# Patient Record
Sex: Male | Born: 1980 | Race: White | Hispanic: No | Marital: Single | Smoking: Former smoker
Health system: Southern US, Community
[De-identification: ages and names within clinical notes are randomized; demographics above are authoritative.]

## PROBLEM LIST (undated history)

## (undated) DIAGNOSIS — E349 Endocrine disorder, unspecified: Secondary | ICD-10-CM

## (undated) DIAGNOSIS — F930 Separation anxiety disorder of childhood: Secondary | ICD-10-CM

## (undated) DIAGNOSIS — K921 Melena: Secondary | ICD-10-CM

## (undated) DIAGNOSIS — D751 Secondary polycythemia: Secondary | ICD-10-CM

## (undated) DIAGNOSIS — H9319 Tinnitus, unspecified ear: Secondary | ICD-10-CM

## (undated) HISTORY — DX: Tinnitus, unspecified ear: H93.19

## (undated) HISTORY — PX: CARPAL TUNNEL RELEASE: SHX101

## (undated) HISTORY — PX: TESTICLE REMOVAL: SHX68

## (undated) HISTORY — DX: Separation anxiety disorder of childhood: F93.0

## (undated) HISTORY — DX: Melena: K92.1

## (undated) HISTORY — DX: Endocrine disorder, unspecified: E34.9

---

## 2015-10-29 ENCOUNTER — Ambulatory Visit (INDEPENDENT_AMBULATORY_CARE_PROVIDER_SITE_OTHER): Payer: BLUE CROSS/BLUE SHIELD | Admitting: Primary Care

## 2015-10-29 ENCOUNTER — Encounter (INDEPENDENT_AMBULATORY_CARE_PROVIDER_SITE_OTHER): Payer: Self-pay

## 2015-10-29 ENCOUNTER — Encounter: Payer: Self-pay | Admitting: Primary Care

## 2015-10-29 VITALS — BP 146/84 | HR 78 | Temp 98.4°F | Ht 71.0 in | Wt 235.8 lb

## 2015-10-29 DIAGNOSIS — E291 Testicular hypofunction: Secondary | ICD-10-CM

## 2015-10-29 DIAGNOSIS — F930 Separation anxiety disorder of childhood: Secondary | ICD-10-CM | POA: Insufficient documentation

## 2015-10-29 DIAGNOSIS — E349 Endocrine disorder, unspecified: Secondary | ICD-10-CM | POA: Insufficient documentation

## 2015-10-29 DIAGNOSIS — H9319 Tinnitus, unspecified ear: Secondary | ICD-10-CM | POA: Insufficient documentation

## 2015-10-29 DIAGNOSIS — H9312 Tinnitus, left ear: Secondary | ICD-10-CM

## 2015-10-29 MED ORDER — TESTOSTERONE CYPIONATE 200 MG/ML IM SOLN: 200.0000 mg | INTRAMUSCULAR | Status: DC

## 2015-10-29 NOTE — Assessment & Plan Note (Signed)
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 Bad divorce that ended due to infidelity.  Offered therapy, he would like to see psychiatrist. Referral placed. Denies SI/HI recently. Currently no medication management.

## 2015-10-29 NOTE — Patient Instructions (Addendum)
Stop by the front desk and speak with either Shirlee LimerickMarion or Revonda StandardAllison regarding your referral to Psychiatry, ENT and Urology.   Urology will manage your testosterone deficiency moving forward. Please establish care with them as discussed.  Please schedule a physical with me within the next 3-6 months. You may also schedule a lab only appointment 3-4 days prior. We will discuss your lab results in detail during your physical.  It was a pleasure to meet you today! Please don't hesitate to call me with any questions. Welcome to [facility]!
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
## 2015-10-29 NOTE — Assessment & Plan Note (Signed)
Located to left ear for years. Symptoms wax and wane. Would like specialist opinion for treatment even though he's aware that treatment may be limited. Referral placed to ENT for further evaluation.

## 2015-10-29 NOTE — Assessment & Plan Note (Signed)
Present since infancy after dog attack which required surgical removal of testicles. Managed on injections currently and needs refills. [place] and needs to establish with Urology for monitoring. Refill provided. Referral placed for Urology management.

## 2015-10-29 NOTE — Progress Notes (Signed)
Pre visit review using our clinic review tool, if applicable. No additional management support is needed unless otherwise documented below in the visit note. 

## 2015-11-05 ENCOUNTER — Telehealth: Payer: Self-pay | Admitting: *Deleted

## 2015-11-05 ENCOUNTER — Other Ambulatory Visit: Payer: Self-pay | Admitting: *Deleted

## 2015-11-05 DIAGNOSIS — E349 Endocrine disorder, unspecified: Secondary | ICD-10-CM

## 2015-11-05 NOTE — Telephone Encounter (Signed)
[facility] for prior authorization  Placed in Kate's inbox to complete.

## 2015-11-05 NOTE — Telephone Encounter (Signed)
Completed and placed in Chans inbox. 

## 2015-11-05 NOTE — Telephone Encounter (Signed)
Testosterone injections requiring PA. Completed on Cover My Meds. Will await determination.

## 2015-11-06 NOTE — Telephone Encounter (Signed)
Sent the form with the lab results to [facility].

## 2015-11-06 NOTE — Telephone Encounter (Signed)
Arthur Ford [facility] requesting testosterone labs total number and reference range faxed to [phone].

## 2015-11-07 NOTE — Telephone Encounter (Addendum)
Per Johny Drillinghan, this has already been done. See below.

## 2015-11-12 NOTE — Telephone Encounter (Signed)
Pt lmovm stating that he is still not able to pick up Rx--lmovm--will call insurance

## 2015-11-13 NOTE — Telephone Encounter (Signed)
Called patient back. Patient did sound a little upset but not at us, the situation since he is out of the medication. Notified patient that I did send the form and lab result [facility] on 11/06/2015. Patient stated that he was told by [facility] Burna Mortimer(Wanda) from claims dept that they need to know his symptoms of why he needs the testosterone.  Also just received fax from [facility] that the Prior Auth was denial because the request does not meet the definition of Medical Necessity found in the member's benefit booklet. It also stated this medication is approved for males with symptoms of low testosterone. In this case, the member does not have symptoms of low testosterone.   Patient would like us to call [employer] to give symptoms. Please advise.

## 2015-11-13 NOTE — Telephone Encounter (Signed)
Please call patient back at [phone].

## 2015-11-14 NOTE — Telephone Encounter (Signed)
Also if after the received the additional information and still dental then next step would be an appeal.

## 2015-11-14 NOTE — Telephone Encounter (Signed)
Called [employer] regarding the prior auth. Was told that we can send in additional information like progress notes and labs to [facility] courtesy review. Will send Kate's progress notes of last visit and labs from patient again.

## 2015-11-14 NOTE — Telephone Encounter (Signed)
His most recent labs have been scanned into Epic, please include this with my progress notes. If we need updated labs, then please schedule him at his convenience.  Thanks.

## 2015-11-14 NOTE — Telephone Encounter (Signed)
Received additional form to complete. Placed in Arthur Ford's inbox.

## 2015-11-14 NOTE — Telephone Encounter (Signed)
Based off review from my last note his symptoms include fatigue and low sex drive. Please call [employer] to see what we can do to help this patient.  Any medication alternatives that would be approved? Androgel?  What can we do to get this patient's medication/testosterone approved?

## 2015-11-14 NOTE — Telephone Encounter (Signed)
Arthur Ford. Sent recent labs and Kate's progress notes to [facility] provider courtesy review at [phone]. Ref # [id number]

## 2015-11-19 NOTE — Telephone Encounter (Signed)
Received faxed response back from [facility]esy [facility].  Request has been review and now is approved.  Effective dates of 11/05/2015 thru 08/17/2038.

## 2015-11-19 NOTE — Telephone Encounter (Signed)
Called and notified patient of results. Patient verbalized understanding 

## 2016-05-14 DIAGNOSIS — Z125 Encounter for screening for malignant neoplasm of prostate: Secondary | ICD-10-CM | POA: Diagnosis not present

## 2016-05-14 DIAGNOSIS — E291 Testicular hypofunction: Secondary | ICD-10-CM | POA: Diagnosis not present

## 2016-05-19 DIAGNOSIS — Z125 Encounter for screening for malignant neoplasm of prostate: Secondary | ICD-10-CM | POA: Diagnosis not present

## 2016-05-19 DIAGNOSIS — Q550 Absence and aplasia of testis: Secondary | ICD-10-CM | POA: Diagnosis not present

## 2016-05-19 DIAGNOSIS — E291 Testicular hypofunction: Secondary | ICD-10-CM | POA: Diagnosis not present

## 2016-05-19 DIAGNOSIS — N521 Erectile dysfunction due to diseases classified elsewhere: Secondary | ICD-10-CM | POA: Diagnosis not present

## 2017-01-20 DIAGNOSIS — E291 Testicular hypofunction: Secondary | ICD-10-CM | POA: Diagnosis not present

## 2017-01-20 DIAGNOSIS — N521 Erectile dysfunction due to diseases classified elsewhere: Secondary | ICD-10-CM | POA: Diagnosis not present

## 2017-01-28 DIAGNOSIS — N521 Erectile dysfunction due to diseases classified elsewhere: Secondary | ICD-10-CM | POA: Diagnosis not present

## 2017-01-28 DIAGNOSIS — E291 Testicular hypofunction: Secondary | ICD-10-CM | POA: Diagnosis not present

## 2017-03-13 ENCOUNTER — Other Ambulatory Visit: Payer: Self-pay | Admitting: Urology

## 2017-03-13 DIAGNOSIS — D751 Secondary polycythemia: Secondary | ICD-10-CM
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
## 2017-03-31 ENCOUNTER — Ambulatory Visit (HOSPITAL_COMMUNITY): Admission: RE | Admit: 2017-03-31 | Payer: BLUE CROSS/BLUE SHIELD | Source: Ambulatory Visit | Admitting: Urology

## 2017-04-03 ENCOUNTER — Encounter (HOSPITAL_COMMUNITY): Payer: Self-pay

## 2017-04-03 ENCOUNTER — Ambulatory Visit (HOSPITAL_COMMUNITY)
Admission: RE | Admit: 2017-04-03 | Discharge: 2017-04-03 | Disposition: A | Discharge status: Home / Self Care | Payer: BLUE CROSS/BLUE SHIELD | Source: Ambulatory Visit | Attending: Urology | Admitting: Urology

## 2017-04-03 DIAGNOSIS — D751 Secondary polycythemia: Secondary | ICD-10-CM | POA: Insufficient documentation

## 2017-04-03 HISTORY — DX: Secondary polycythemia: D75.1

## 2017-04-03 NOTE — Discharge Instructions (Signed)
Polycythemia  Polycythemia vera (PV), or myeloproliferative disease, which the bone marrow makes too many (overproduces) red blood cells. The bone marrow may also make too many clotting cells (platelets) and white blood cells. Bone marrow is the spongy center of bones where blood cells are produced. Sometimes, there may be an overproduction of blood cells in the liver and spleen, causing those organs to become enlarged. Additionally, people who have PV are at a higher risk for stroke or heart attack because their blood may clot more easily. PV is a long-term disease. What are the causes? Almost all people who have PV have an abnormal gene (genetic mutation) that causes changes in the way that the bone marrow makes blood cells. This gene, which is called JAK2, is not passed along from parent to child (is not hereditary). It is not known what triggers the genetic mutation that causes the body to produce too many red blood cells. What increases the risk? This condition is more likely to develop in:  Males.  People who are 39 years of age or older.  What are the signs or symptoms? You may not have any symptoms in the early stage of PV. When symptoms develop, they may include:  Shortness of breath.  Dizziness.  Hot and flushed skin.  Itchy skin.  Sweats, especially night sweats.  Headache.  Tiredness.  Ringing in the ears.  Blurred vision or blind spots.  Bone pain.  Weight loss.  Fever.  Blood-tinged vomit or bowel movements.  How is this diagnosed? This condition may be diagnosed during a routine physical exam if you have a blood test called a complete blood count (CBC). Your health care provider also may suspect PV if you have symptoms. During the physical exam, your provider may find that you have an enlarged liver or spleen. You may also have tests to confirm the diagnosis. These may include:  A procedure to remove a sample of bone marrow for testing (bone marrow  biopsy).  Blood tests to check for: ? The JAK2 gene. ? Low levels of a hormone that helps to regulate blood production (erythropoietin).  How is this treated? There is no cure for PV, but treatment can help to control the disease. There are several types of treatment. No single treatment works for everyone. You will need to work with a blood cancer specialist (hematologist) to find the treatment that is best for you. Options include:  Periodically having some blood removed with a needle (drawn) to lower the number of red blood cells (phlebotomy).  Medicine. Your health care provider may recommend: ? Low-dose aspirin to lower your risk for blood clots. ? A medicine to reduce red blood cell production (hydroxyurea). ? A medicine to lower the number of red blood cells (interferon). ? A medicine that slows down the effects of JAK2 (ruxolitinib).  Follow these instructions at home:  Take over-the-counter and prescription medicines only as told by your health care provider.  Return to your normal activities as told by your health care provider. Ask your health care provider what activities are safe for you.  Do not use tobacco products, including cigarettes, chewing tobacco, or e-cigarettes. If you need help quitting, ask your health care provider.  Keep all follow-up visits as told by your health care provider. This is important. Contact a health care provider if:  You have side effects from your medicines.  Your symptoms change or get worse at home.  You have blood in your stool or you vomit  blood. Get help right away if:  You have sudden and severe pain in your abdomen.  You have chest pain or difficulty breathing.  You have signs of stroke, such as: ? Sudden numbness. ? Weakness of your face or arm. ? Confusion. ? Difficulty speaking or understanding speech. These symptoms may represent a serious problem that is an emergency. Do not wait to see if the symptoms will go away.  Get medical help right away. Call your local emergency services (911 in the [place] Do not drive yourself to the hospital. This information is not intended to replace advice given to you by your health care provider. Make sure you discuss any questions you have with your health care provider. Document Released: 04/29/2001 Document Revised: 01/10/2016 Document Reviewed: 02/14/2015 Elsevier Interactive Patient Education  2018 [employer]. Therapeutic Phlebotomy, Care After Refer to this sheet in the next few weeks. These instructions provide you with information about caring for yourself after your procedure. Your health care provider may also give you more specific instructions. Your treatment has been planned according to current medical practices, but problems sometimes occur. Call your health care provider if you have any problems or questions after your procedure. What can I expect after the procedure? After the procedure, it is common to have:  Light-headedness or dizziness. You may feel faint.  Nausea.  Tiredness.  Follow these instructions at home: Activity  Return to your normal activities as directed by your health care provider. Most people can go back to their normal activities right away.  Avoid strenuous physical activity and heavy lifting or pulling for about 5 hours after the procedure. Do not lift anything that is heavier than 10 lb (4.5 kg).  Athletes should avoid strenuous exercise for at least 12 hours.  Change positions slowly for the remainder of the day. This will help to prevent light-headedness or fainting.  If you feel light-headed, lie down until the feeling goes away. Eating and drinking  Be sure to eat well-balanced meals for the next 24 hours.  Drink enough fluid to keep your urine clear or pale yellow.  Avoid drinking alcohol on the day that you had the procedure. Care of the Needle Insertion Site  Keep your bandage dry. You can remove the bandage  after about 5 hours or as directed by your health care provider.  If you have bleeding from the needle insertion site, elevate your arm and press firmly on the site until the bleeding stops.  If you have bruising at the site, apply ice to the area: ? Put ice in a plastic bag. ? Place a towel between your skin and the bag. ? Leave the ice on for 20 minutes, 2-3 times a day for the first 24 hours.  If the swelling does not go away after 24 hours, apply a warm, moist washcloth to the area for 20 minutes, 2-3 times a day. General instructions  Avoid smoking for at least 30 minutes after the procedure.  Keep all follow-up visits as directed by your health care provider. It is important to continue with further therapeutic phlebotomy treatments as directed. Contact a health care provider if:  You have redness, swelling, or pain at the needle insertion site.  You have fluid, blood, or pus coming from the needle insertion site.  You feel light-headed, dizzy, or nauseated, and the feeling does not go away.  You notice new bruising at the needle insertion site.  You feel weaker than normal.  You have a  fever or chills. Get help right away if:  You have severe nausea or vomiting.  You have chest pain.  You have trouble breathing. This information is not intended to replace advice given to you by your health care provider. Make sure you discuss any questions you have with your health care provider. Document Released: 01/06/2011 Document Revised: 04/05/2016 Document Reviewed: 07/31/2014 Elsevier Interactive Patient Education  [employer].

## 2017-04-03 NOTE — Progress Notes (Signed)
Arthur Ford presents today for phlebotomy per MD orders. Phlebotomy procedure started at 1150 and ended at 1200. 150 cc removed. Patient tolerated procedure well.

## 2017-07-23 DIAGNOSIS — Z125 Encounter for screening for malignant neoplasm of prostate: Secondary | ICD-10-CM | POA: Diagnosis not present

## 2017-07-23 DIAGNOSIS — E291 Testicular hypofunction: Secondary | ICD-10-CM | POA: Diagnosis not present

## 2017-10-02 DIAGNOSIS — H52223 Regular astigmatism, bilateral: Secondary | ICD-10-CM | POA: Diagnosis not present

## 2017-10-16 DIAGNOSIS — E291 Testicular hypofunction: Secondary | ICD-10-CM | POA: Diagnosis not present

## 2017-10-16 DIAGNOSIS — N521 Erectile dysfunction due to diseases classified elsewhere: Secondary | ICD-10-CM | POA: Diagnosis not present

## 2017-10-26 ENCOUNTER — Encounter: Payer: Self-pay | Admitting: Family Medicine

## 2017-10-26 ENCOUNTER — Ambulatory Visit: Payer: BLUE CROSS/BLUE SHIELD | Admitting: Family Medicine

## 2017-10-26 VITALS — BP 118/76 | HR 81 | Temp 98.3°F | Wt 243.0 lb

## 2017-10-26 DIAGNOSIS — M545 Low back pain, unspecified: Secondary | ICD-10-CM | POA: Insufficient documentation

## 2017-10-26 HISTORY — DX: Low back pain, unspecified: M54.50

## 2017-10-26 MED ORDER — METHOCARBAMOL 500 MG PO TABS: 500.0000 mg | ORAL_TABLET | Freq: Three times a day (TID) | ORAL | 0 refills | Status: DC | PRN

## 2017-10-26 MED ORDER — PREDNISONE 20 MG PO TABS: ORAL_TABLET | ORAL | 0 refills | Status: DC

## 2017-10-26 NOTE — Assessment & Plan Note (Signed)
Anticipate lumbar strain. Neurological exam benign today. Isolated episode of bowel accident with injury but able to control bowels/bladder since. Anticipate due to acute pain. rec supportive care - rest, prednisone course, muscle relaxant, then restart NSAID, ice/heating pad, gentle stretching. Out of work x 3 days. Update if not improving as expected or red flags or neurological changes. Pt agrees with plan.

## 2017-10-26 NOTE — Patient Instructions (Addendum)
Possible lumbar strain suffered last night. Treat with prednisone course and muscle relaxant. May use ice pack to back for next few days then transition to heating pad.  Gentle stretching of back as tolerated.  Let us know if any fevers, numbness/weakness of legs, or trouble controlling bowel/bladder.   Low Back Sprain A sprain is a stretch or tear in the bands of tissue that hold bones and joints together (ligaments). Sprains of the lower back (lumbar spine) are a common cause of low back pain. A sprain occurs when ligaments are overextended or stretched beyond their limits. The ligaments can become inflamed, resulting in pain and sudden muscle tightening (spasms). A sprain can be caused by an injury (trauma), or it can develop gradually due to overuse. There are three types of sprains:  Grade 1 is a mild sprain involving an overstretched ligament or a very slight tear of the ligament.  Grade 2 is a moderate sprain involving a partial tear of the ligament.  Grade 3 is a severe sprain involving a complete tear of the ligament.  What are the causes? This condition may be caused by:  Trauma, such as a fall or a hit to the body.  Twisting or overstretching the back. This may result from doing activities that require a lot of energy, such as lifting heavy objects.  What increases the risk? The following factors may increase your risk of getting this condition:  Playing contact sports.  Participating in sports or activities that put excessive stress on the back and require a lot of bending and twisting, including: ? Lifting weights or heavy objects. ? Gymnastics. ? Soccer. ? Figure skating. ? Snowboarding.  Being overweight or obese.  Having poor strength and flexibility.  What are the signs or symptoms? Symptoms of this condition may include:  Sharp or dull pain in the lower back that does not go away. Pain may extend to the buttocks.  Stiffness.  Limited range of  motion.  Inability to stand up straight due to stiffness or pain.  Muscle spasms.  How is this diagnosed?  This condition may be diagnosed based on:  Your symptoms.  Your medical history.  A physical exam. ? Your health care provider may push on certain areas of your back to determine the source of your pain. ? You may be asked to bend forward, backward, and side to side to assess the severity of your pain and your range of motion.  Imaging tests, such as: ? X-rays. ? MRI.  How is this treated? Treatment for this condition may include:  Applying heat and cold to the affected area.  Medicines to help relieve pain and to relax your muscles (muscle relaxants).  NSAIDs to help reduce swelling and discomfort.  Physical therapy.  When your symptoms improve, it is important to gradually return to your normal routine as soon as possible to reduce pain, avoid stiffness, and avoid loss of muscle strength. Generally, symptoms should improve within 6 weeks of treatment. However, recovery time varies. Follow these instructions at home: Managing pain, stiffness, and swelling  If directed, apply ice to the injured area during the first 24 hours after your injury. ? Put ice in a plastic bag. ? Place a towel between your skin and the bag. ? Leave the ice on for 20 minutes, 2-3 times a day.  If directed, apply heat to the affected area as often as told by your health care provider. Use the heat source that your health care provider  recommends, such as a moist heat pack or a heating pad. ? Place a towel between your skin and the heat source. ? Leave the heat on for 20-30 minutes. ? Remove the heat if your skin turns bright red. This is especially important if you are unable to feel pain, heat, or cold. You may have a greater risk of getting burned. Activity  Rest and return to your normal activities as told by your health care provider. Ask your health care provider what activities are  safe for you.  Avoid activities that take a lot of effort (are strenuous) for as long as told by your health care provider.  Do exercises as told by your health care provider. General instructions   Take over-the-counter and prescription medicines only as told by your health care provider.  If you have questions or concerns about safety while taking pain medicine, talk with your health care provider.  Do not drive or operate heavy machinery until you know how your pain medicine affects you.  Do not use any tobacco products, such as cigarettes, chewing tobacco, and e-cigarettes. Tobacco can delay bone healing. If you need help quitting, ask your health care provider.  Keep all follow-up visits as told by your health care provider. This is important. How is this prevented?  Warm up and stretch before being active.  Cool down and stretch after being active.  Give your body time to rest between periods of activity.  Avoid: ? Being physically inactive for long periods at a time. ? Exercising or playing sports when you are tired or in pain.  Use correct form when playing sports and lifting heavy objects.  Use good posture when sitting and standing.  Maintain a healthy weight.  Sleep on a mattress with medium firmness to support your back.  Make sure to use equipment that fits you, including shoes that fit well.  Be safe and responsible while being active to avoid falls.  Do at least 150 minutes of moderate-intensity exercise each week, such as brisk walking or water aerobics. Try a form of exercise that takes stress off your back, such as swimming or stationary cycling.  Maintain physical fitness, including: ? Strength. In particular, develop and maintain strong abdominal muscles. ? Flexibility. ? Cardiovascular fitness. ? Endurance. Contact a health care provider if:  Your back pain does not improve after 6 weeks of treatment.  Your symptoms get worse. Get help right  away if:  Your back pain is severe.  You are unable to stand or walk.  You develop pain in your legs.  You develop weakness in your buttocks or legs.  You have difficulty controlling when you urinate or when you have a bowel movement. This information is not intended to replace advice given to you by your health care provider. Make sure you discuss any questions you have with your health care provider. Document Released: 08/04/2005 Document Revised: 04/10/2016 Document Reviewed: 05/16/2015 Elsevier Interactive Patient Education  [employer]2018 [employer]

## 2017-10-26 NOTE — Progress Notes (Signed)
BP 118/76 (BP Location: Left Arm, Patient Position: Sitting, Cuff Size: Large)   Pulse 81   Temp 98.3 F (36.8 C) (Oral)   Wt 243 lb (110.2 kg)   SpO2 97%   BMI 33.89 kg/m    CC: back pain Subjective:    Patient ID: Arthur Ford, male    DOB: [birth year], 37 y.o.   MRN: [id number]  HPI: Arthur Ford is a 38 y.o. male presenting on 10/26/2017 for Back Pain (Bent at work last night while lowering a 15 lb item and felt a "pop". Initially felt pain in lower right back but now feels in lower left side. Pain is constant and dull. With movement, pain is sharp at about a 7. Took ibuprofen 800 mg at 2:00 AM, not helpful. 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 Initial pain R lower back, now L lower back pain as well. Currently 4/10 pain. Treated with 4 ibuprofen twice overnight. Worse with transitions to standing - sharp 7/10 pain. Denies shooting pain down legs. No numbness or weakness of legs. No saddle anesthesia. No fevers/chills. Episode of bowel incontinence when it happened, not since.   H/o similar injury years ago which improved after chiropractor therapy.  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 Told deep muscle bruise.  Relevant past medical, surgical, family and social history reviewed and updated as indicated. Interim medical history since our last visit reviewed. Allergies and medications reviewed and updated. Outpatient Medications Prior to Visit  Medication Sig Dispense Refill  . ibuprofen (ADVIL,MOTRIN) 200 MG tablet Take 200 mg by mouth every 6 (six) hours as needed.    . Multiple Vitamin (MULTIVITAMIN) capsule Take 1 capsule by mouth daily.    Marland Kitchen testosterone cypionate (DEPOTESTOSTERONE CYPIONATE) 200 MG/ML injection Inject 1 mL (200 mg total) into the muscle once a week. (Patient taking differently: Inject 200 mg into  the muscle once a week. 1/2 ml weekly) 10 mL 0   No facility-administered medications prior to visit.      Per HPI unless specifically indicated in ROS section below Review of Systems     Objective:    BP 118/76 (BP Location: Left Arm, Patient Position: Sitting, Cuff Size: Large)   Pulse 81   Temp 98.3 F (36.8 C) (Oral)   Wt 243 lb (110.2 kg)   SpO2 97%   BMI 33.89 kg/m   Wt Readings from Last 3 Encounters:  10/26/17 243 lb (110.2 kg)  04/03/17 244 lb 12.8 oz (111 kg)  10/29/15 235 lb 12.8 oz (107 kg)    Physical Exam  Constitutional: He appears well-developed and well-nourished. No distress.  Musculoskeletal: He exhibits no edema.  Discomfort to palpation midline lumbar spine Mild mid lumbar paraspinous mm tenderness Neg SLR bilaterally. No pain with int/ext rotation at hip. Neg FABER. No pain at SIJ, GTB or sciatic notch bilaterally.   Neurological: He is alert. He has normal strength. No sensory deficit. Coordination normal.  5/5 strength BLE Able to heel and toe walk Antalgic gait  Skin: Skin is warm and dry. No rash noted. No erythema.  Nursing note and vitals reviewed.  No results found for this or any previous visit.    Assessment & Plan:   Problem List Items Addressed This Visit    Lower back pain -  Primary    Anticipate lumbar strain. Neurological exam benign today. Isolated episode of bowel accident with injury but able to control bowels/bladder since. Anticipate due to acute pain. rec supportive care - rest, prednisone course, muscle relaxant, then restart NSAID, ice/heating pad, gentle stretching. Out of work x 3 days. Update if not improving as expected or red flags or neurological changes. Pt agrees with plan.       Relevant Medications   methocarbamol (ROBAXIN) 500 MG tablet   predniSONE (DELTASONE) 20 MG tablet       Meds ordered this encounter  Medications  . DISCONTD: predniSONE (DELTASONE) 20 MG tablet    Sig: Take two tablets daily for 3  days followed by one tablet daily for 4 days    Dispense:  10 tablet    Refill:  0  . DISCONTD: methocarbamol (ROBAXIN) 500 MG tablet    Sig: Take 1 tablet (500 mg total) by mouth 3 (three) times daily as needed for muscle spasms (sedation precautions).    Dispense:  30 tablet    Refill:  0  . methocarbamol (ROBAXIN) 500 MG tablet    Sig: Take 1 tablet (500 mg total) by mouth 3 (three) times daily as needed for muscle spasms (sedation precautions).    Dispense:  30 tablet    Refill:  0  . predniSONE (DELTASONE) 20 MG tablet    Sig: Take two tablets daily for 3 days followed by one tablet daily for 4 days    Dispense:  10 tablet    Refill:  0   No orders of the defined types were placed in this encounter.   Follow up plan: Return if symptoms worsen or fail to improve.  Eustaquio BoydenJavier Rishav Rockefeller, MD

## 2018-01-18 DIAGNOSIS — Z125 Encounter for screening for malignant neoplasm of prostate: Secondary | ICD-10-CM | POA: Diagnosis not present

## 2018-01-18 DIAGNOSIS — E291 Testicular hypofunction: Secondary | ICD-10-CM | POA: Diagnosis not present

## 2018-04-23 ENCOUNTER — Encounter: Payer: Self-pay | Admitting: Family Medicine

## 2018-04-23 ENCOUNTER — Ambulatory Visit: Payer: BLUE CROSS/BLUE SHIELD | Admitting: Family Medicine

## 2018-04-23 ENCOUNTER — Ambulatory Visit (INDEPENDENT_AMBULATORY_CARE_PROVIDER_SITE_OTHER)
Admission: RE | Admit: 2018-04-23 | Discharge: 2018-04-23 | Disposition: A | Discharge status: Home / Self Care | Payer: BLUE CROSS/BLUE SHIELD | Source: Ambulatory Visit | Attending: Family Medicine | Admitting: Family Medicine

## 2018-04-23 VITALS — BP 132/78 | HR 79 | Temp 98.2°F | Ht 71.0 in | Wt 247.5 lb

## 2018-04-23 DIAGNOSIS — M159 Polyosteoarthritis, unspecified: Secondary | ICD-10-CM

## 2018-04-23 DIAGNOSIS — M1990 Unspecified osteoarthritis, unspecified site: Secondary | ICD-10-CM | POA: Insufficient documentation

## 2018-04-23 DIAGNOSIS — R0981 Nasal congestion: Secondary | ICD-10-CM | POA: Diagnosis not present

## 2018-04-23 DIAGNOSIS — M150 Primary generalized (osteo)arthritis: Secondary | ICD-10-CM | POA: Diagnosis not present

## 2018-04-23 DIAGNOSIS — M79641 Pain in right hand: Secondary | ICD-10-CM | POA: Diagnosis not present

## 2018-04-23 DIAGNOSIS — M720 Palmar fascial fibromatosis [Dupuytren]: Secondary | ICD-10-CM | POA: Insufficient documentation

## 2018-04-23 HISTORY — DX: Nasal congestion: R09.81

## 2018-04-23 NOTE — Assessment & Plan Note (Signed)
Anticipate wear and tear osteoarthritis. Discussed avoiding proinflammatory foods, may continue turmeric as well as discussed glucosamine and NSAID use. 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 He will look into limiting work hours.

## 2018-04-23 NOTE — Progress Notes (Signed)
BP 132/78 (BP Location: Right Arm, Patient Position: Sitting, Cuff Size: Large)   Pulse 79   Temp 98.2 F (36.8 C) (Oral)   Ht 5\' 11"  (1.803 m)   Wt 247 lb 8 oz (112.3 kg)   SpO2 96%   BMI 34.52 kg/m    CC: worsening joint pains Subjective:    Patient ID: Percell Schlaud, male    DOB: 02-27-1981, 37 y.o.   MRN: [id number]  HPI: Jayvonni China is a 37 y.o. male presenting on 04/23/2018 for Joint Pain (C/o bilateral hand and knee pain. Started yrs ago, worsening. Thinks it may be arthritis. Has noticed knuckles increasing in size and sometimes has swelling in knees. Taking tumeric forte. Seen chiroprator and told left leg is slightly shorter and may be reason for knee pain. ) and Nasal Congestion (C/o yellowish nasal drainage and sinus congestion in left side of face. 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 Also has some neck and lower back soreness/pain. Feels knuckles are enlarging, especially with pain at R 2nd PIP joint. Some redness of knuckles of hands. Has seen chiropractor in the past. Has tried turmeric OTC with some benefit. Right handed. No rash. Takes ibuprofen.   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 Worse hand pain after long hours at work.   2 night h/o nasal congestion, and maxillary sinus pressure, then last night noted spontaneous drainage of yellow mucous and fluid out of R nostril. Denies recent inciting trauma/injury. Denies fevers/chills, ear or tooth pain, ST, cough.  Has tried OTC Hi-C vitamins.   Relevant past medical, surgical, family and social history reviewed and updated as indicated. Interim medical history since our last visit reviewed. Allergies and medications reviewed and updated. Outpatient Medications Prior to Visit  Medication Sig Dispense Refill  . ibuprofen (ADVIL,MOTRIN) 200 MG tablet Take 200 mg by mouth every 6 (six) hours  as needed.    . Multiple Vitamin (MULTIVITAMIN) capsule Take 1 capsule by mouth daily.    Marland Kitchen testosterone cypionate (DEPOTESTOSTERONE CYPIONATE) 200 MG/ML injection Inject 1 mL (200 mg total) into the muscle once a week. (Patient taking differently: Inject 200 mg into the muscle once a week. 1/2 ml weekly) 10 mL 0  . methocarbamol (ROBAXIN) 500 MG tablet Take 1 tablet (500 mg total) by mouth 3 (three) times daily as needed for muscle spasms (sedation precautions). 30 tablet 0  . predniSONE (DELTASONE) 20 MG tablet Take two tablets daily for 3 days followed by one tablet daily for 4 days 10 tablet 0   No facility-administered medications prior to visit.      Per HPI unless specifically indicated in ROS section below Review of Systems     Objective:    BP 132/78 (BP Location: Right Arm, Patient Position: Sitting, Cuff Size: Large)   Pulse 79   Temp 98.2 F (36.8 C) (Oral)   Ht 5\' 11"  (1.803 m)   Wt 247 lb 8 oz (112.3 kg)   SpO2 96%   BMI 34.52 kg/m   Wt Readings from Last 3 Encounters:  04/23/18 247 lb 8 oz (112.3 kg)  10/26/17 243 lb (110.2 kg)  04/03/17 244 lb 12.8 oz (111 kg)    Physical Exam  Constitutional: He appears well-developed and well-nourished. No distress.  HENT:  Head: Normocephalic and atraumatic.  Right Ear: Hearing, tympanic membrane, external ear and ear canal normal.  Left Ear: Tympanic membrane, external ear and ear canal normal. Decreased hearing (chronic) is noted.  Nose: Mucosal edema (nasal mucosal congestion without significant discharge) present. No rhinorrhea. Right sinus exhibits no maxillary sinus tenderness and no frontal sinus tenderness. Left sinus exhibits no maxillary sinus tenderness and no frontal sinus tenderness.  Mouth/Throat: Uvula is midline, oropharynx is clear and moist and mucous membranes are normal. No oropharyngeal exudate, posterior oropharyngeal edema, posterior oropharyngeal erythema or tonsillar abscesses.  Eyes: Pupils are equal,  round, and reactive to light. Conjunctivae and EOM are normal. No scleral icterus.  Neck: Normal range of motion. Neck supple.  Musculoskeletal: Normal range of motion. He exhibits no edema.  2+ DP bilaterally R index finger PIP joint tender to palpation with mild swelling noted. No other joint swelling or pain of bilateral hands. No pain at 1st [facility]  No active synovitis. FROM at digits and wrists FROM at knees without any reproducible pain/tenderness today  Lymphadenopathy:    He has no cervical adenopathy.  Skin: Skin is warm and dry. No rash noted.  Nursing note and vitals reviewed.  No results found for this or any previous visit.    Assessment & Plan:   Problem List Items Addressed This Visit    Pain of right hand - Primary    Anticipate wear and tear osteoarthritis. Discussed avoiding proinflammatory foods, may continue turmeric as well as discussed glucosamine and NSAID use. 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 He will look into limiting work hours.       Relevant Orders   DG Hand Complete Right   Osteoarthritis   Nasal congestion    Anticipate viral - will watch for now. Red flags to seek further care reviewed.           No orders of the defined types were placed in this encounter.  Orders Placed This Encounter  Procedures  . DG Hand Complete Right    Standing Status:   Future    Number of Occurrences:   1    Standing Expiration Date:   06/24/2019    Order Specific Question:   Reason for Exam (SYMPTOM  OR DIAGNOSIS REQUIRED)    Answer:   right hand pain predominantly at 2nd PIP    Order Specific Question:   Preferred imaging location?    Answer:   [facility]    Order Specific Question:   Radiology Contrast Protocol - do NOT remove file path    Answer:   [email or web address]    Follow up plan: No follow-ups on file.  Eustaquio Boyden, MD

## 2018-04-23 NOTE — Patient Instructions (Signed)
Let's check xray of right hand today - I'm suspicious for osteoarthritis or wear and tear arthritis. We will be in touch with results. Let's watch nasal congestion - may be viral respiraotry infection - should improve over the next week. Let me know if fever >101, worsening drainage, or productive cough develops.

## 2018-04-23 NOTE — Assessment & Plan Note (Signed)
Anticipate viral - will watch for now. Red flags to seek further care reviewed.

## 2018-05-10 ENCOUNTER — Encounter: Payer: Self-pay | Admitting: Family Medicine

## 2018-05-10 ENCOUNTER — Ambulatory Visit: Payer: BLUE CROSS/BLUE SHIELD | Admitting: Family Medicine

## 2018-05-10 VITALS — BP 104/78 | HR 91 | Temp 98.5°F | Ht 71.0 in | Wt 250.5 lb

## 2018-05-10 DIAGNOSIS — M720 Palmar fascial fibromatosis [Dupuytren]: Secondary | ICD-10-CM | POA: Diagnosis not present

## 2018-05-10 DIAGNOSIS — M659 Synovitis and tenosynovitis, unspecified: Secondary | ICD-10-CM | POA: Diagnosis not present

## 2018-05-10 MED ORDER — METHYLPREDNISOLONE ACETATE 40 MG/ML IJ SUSP
20.0000 mg | Freq: Once | INTRAMUSCULAR | Status: AC
  Administered 2018-05-10: 20 mg via INTRA_ARTICULAR

## 2018-05-10 NOTE — Progress Notes (Signed)
Dr. Karleen Hampshire T. Shabree Tebbetts, MD, CAQ Sports [facility] [address] [place], [address] Phone: [phone] Fax: [phone]  05/10/2018  Patient: Arthur Ford, MRN: [id number], DOB: 06-11-1981, 37 y.o.  Primary Physician:  Doreene Nest, NP   Chief Complaint  Patient presents with  . Hand Pain    Right Ring Finger   Subjective:   Arthur Ford is a 37 y.o. very pleasant male patient who presents with the following:  Dupuytren's contracture on the R. 4th  Replacement recently has been having some symptoms on the fourth digit on the right hand.  He saw a Industrial/product designer, and they thought that he might have a trigger finger.  He came in to follow-up with me about potential symptoms with his right fourth digit.  Is not having any numbness or tingling, he does have a topical surface change that feels rough to palpation.  He also has some pain on the flexor tendon throughout basically the entirety aspect of this digit.  Past Medical History, Surgical History, Social History, Family History, Problem List, Medications, and Allergies have been reviewed and updated if relevant.  Patient Active Problem List   Diagnosis Date Noted  . Pain of right hand 04/23/2018  . Osteoarthritis 04/23/2018  . Nasal congestion 04/23/2018  . Lower back pain 10/26/2017  . Testosterone deficiency 10/29/2015  . Separation anxiety 10/29/2015  . Tinnitus 10/29/2015    Past Medical History:  Diagnosis Date  . Blood in stool   . Polycythemia   . Separation anxiety   . Testosterone deficiency   . Tinnitus     Past Surgical History:  Procedure Laterality Date  . TESTICLE REMOVAL      Social History   Socioeconomic History  . Marital status: Single    Spouse name: Not on file  . Number of children: Not on file  . Years of education: Not on file  . Highest education level: Not on file  Occupational History  . Not on file  Social Needs  . Financial  resource strain: Not on file  . Food insecurity:    Worry: Not on file    Inability: Not on file  . Transportation needs:    Medical: Not on file    Non-medical: Not on file  Tobacco Use  . Smoking status: Light Tobacco Smoker    Types: Cigars  . Smokeless tobacco: Never Used  Substance and Sexual Activity  . Alcohol use: Yes    Alcohol/week: 0.0 standard drinks    Comment: soical  . Drug use: No  . Sexual activity: Not on file  Lifestyle  . Physical activity:    Days per week: Not on file    Minutes per session: Not on file  . Stress: Not on file  Relationships  . Social connections:    Talks on phone: Not on file    Gets together: Not on file    Attends religious service: Not on file    Active member of club or organization: Not on file    Attends meetings of clubs or organizations: Not on file    Relationship status: Not on file  . Intimate partner violence:    Fear of current or ex partner: Not on file    Emotionally abused: Not on file    Physically abused: Not on file    Forced sexual activity: Not on file  Other Topics Concern  . Not on file  Social History Narrative  Divorced.   2 children.   Works as a graphic installer.   Enjoys spending time withEngineer, mining girlfriend.     Family History  Problem Relation Age of Onset  . Ovarian cancer Mother   . Stroke Mother   . Hypertension Mother   . Kidney disease Mother   . Diabetes Mother   . Alcohol abuse Father   . Stroke Father   . Hypertension Father   . Heart disease Paternal Grandfather     No Known Allergies  Medication list reviewed and updated in full in [facility].  GEN: No fevers, chills. Nontoxic. Primarily MSK c/o today. MSK: Detailed in the HPI GI: tolerating PO intake without difficulty Neuro: No numbness, parasthesias, or tingling associated. Otherwise the pertinent positives of the ROS are noted above.   Objective:   BP 104/78   Pulse 91   Temp 98.5 F (36.9 C) (Oral)   Ht 5\' 11"   (1.803 m)   Wt 250 lb 8 oz (113.6 kg)   BMI 34.94 kg/m    GEN: WDWN, NAD, Non-toxic, Alert & Oriented x 3 HEENT: Atraumatic, Normocephalic.  Ears and Nose: No external deformity. EXTR: No clubbing/cyanosis/edema NEURO: Normal gait.  PSYCH: Normally interactive. Conversant. Not depressed or anxious appearing.  Calm demeanor.    Full range of motion at all fingers.  There is no triggering.  He does have some mild osteoarthritic changes at the DIP joints.  There is no redness or warmth.  On the volar aspect of the palm at the fourth digit, there is some topical surface change in hardening consistent with early Dupuytren's contracture.  Fully extensible digit.  There is tenderness throughout most of the flexor aspect of the fourth.  Radiology: Dg Hand Complete Right  Result Date: 04/23/2018 CLINICAL DATA:  Right-sided hand pain EXAM: RIGHT HAND - COMPLETE 3+ VIEW COMPARISON:  None. FINDINGS: There is no evidence of fracture or dislocation. There is no evidence of arthropathy or other focal bone abnormality. Soft tissues are unremarkable. IMPRESSION: Negative. Electronically Signed   By: Jasmine PangKim  Fujinaga M.D.   On: 04/23/2018 15:50    Assessment and Plan:   Flexor tenosynovitis of finger  Dupuytren's contracture of hand  He does not appear to have a trigger finger.  He does appear to have early Dupuytren's contracture, and he also has some active flexor tenosynovitis of his fourth digit on the right.  He works in the [employer] industry for [employer], and he works often 12 to 15-hour shifts, sometimes 7 days a week.  For now, we are going to do a flexor tendon sheath injection on the fourth digit on the right, and if he has worsening of his Dupuytren's symptoms, I do think he would benefit from seeing hand surgery.  We reviewed images for all of these conditions as well as potential treatment options.  4th Flexor Tendon Sheath Injection, R Date of procedure: 05/10/2018 Verbal consent was  obtained. Risks (including rare risk of infection, potential risk for skin lightening and potential atrophy), benefits and alternatives were discussed. Prepped with Chloraprep and Ethyl Chloride used for anesthesia. Under sterile conditions, patient injected at palmar crease aiming distally with 45 degree angle towards nodule; injected directly into tendon sheath. Medication flowed freely without resistance.  Needle size: 22 gauge 1 1/2 inch Injection: 1/2 cc of Lidocaine 1% and Depo-Medrol 20 mg  Follow-up: prn pain  Signed,  Ehsan Corvin T. Kylie Gros, MD   Allergies as of 05/10/2018   No Known Allergies  Medication List        Accurate as of 05/10/18 10:11 AM. Always use your most recent med list.          ibuprofen 200 MG tablet Commonly known as:  ADVIL,MOTRIN Take 200 mg by mouth every 6 (six) hours as needed.   multivitamin capsule Take 1 capsule by mouth daily.   testosterone cypionate 200 MG/ML injection Commonly known as:  DEPOTESTOSTERONE CYPIONATE Inject 1 mL (200 mg total) into the muscle once a week.

## 2018-05-10 NOTE — Addendum Note (Signed)
Addended by: Damita LackLORING, Laron Boorman S on: 05/10/2018 10:25 AM   Modules accepted: Orders

## 2018-05-12 ENCOUNTER — Ambulatory Visit: Payer: Self-pay | Admitting: Family Medicine

## 2018-06-01 ENCOUNTER — Other Ambulatory Visit: Payer: Self-pay | Admitting: Family Medicine

## 2018-06-01 ENCOUNTER — Telehealth: Payer: Self-pay | Admitting: Family Medicine

## 2018-06-01 DIAGNOSIS — M720 Palmar fascial fibromatosis [Dupuytren]: Secondary | ICD-10-CM

## 2018-06-01 NOTE — Telephone Encounter (Signed)
Pt stopped by office and stated his hand is getting any better and he would like for you to refer him to a hand surgeon as you discussed with him at last visit. Pt will be awaiting a call from office.

## 2018-06-01 NOTE — Progress Notes (Signed)
done

## 2018-06-01 NOTE — Telephone Encounter (Signed)
done

## 2018-06-08 DIAGNOSIS — M720 Palmar fascial fibromatosis [Dupuytren]: Secondary | ICD-10-CM | POA: Diagnosis not present

## 2018-06-21 DIAGNOSIS — E291 Testicular hypofunction: Secondary | ICD-10-CM | POA: Diagnosis not present

## 2018-06-21 DIAGNOSIS — Z125 Encounter for screening for malignant neoplasm of prostate: Secondary | ICD-10-CM | POA: Diagnosis not present

## 2018-07-20 DIAGNOSIS — Z9289 Personal history of other medical treatment: Secondary | ICD-10-CM | POA: Diagnosis not present

## 2018-07-20 DIAGNOSIS — M720 Palmar fascial fibromatosis [Dupuytren]: Secondary | ICD-10-CM | POA: Diagnosis not present

## 2018-12-20 DIAGNOSIS — Z125 Encounter for screening for malignant neoplasm of prostate: Secondary | ICD-10-CM | POA: Diagnosis not present

## 2018-12-20 DIAGNOSIS — E291 Testicular hypofunction: Secondary | ICD-10-CM | POA: Diagnosis not present

## 2019-04-27 IMAGING — DX DG HAND COMPLETE 3+V*R*
3 series · 3 of 3 positions shown · non-contrast
Comparison: None.

CLINICAL DATA: Right-sided hand pain

EXAM:
RIGHT HAND - COMPLETE 3+ VIEW

[hand ap]
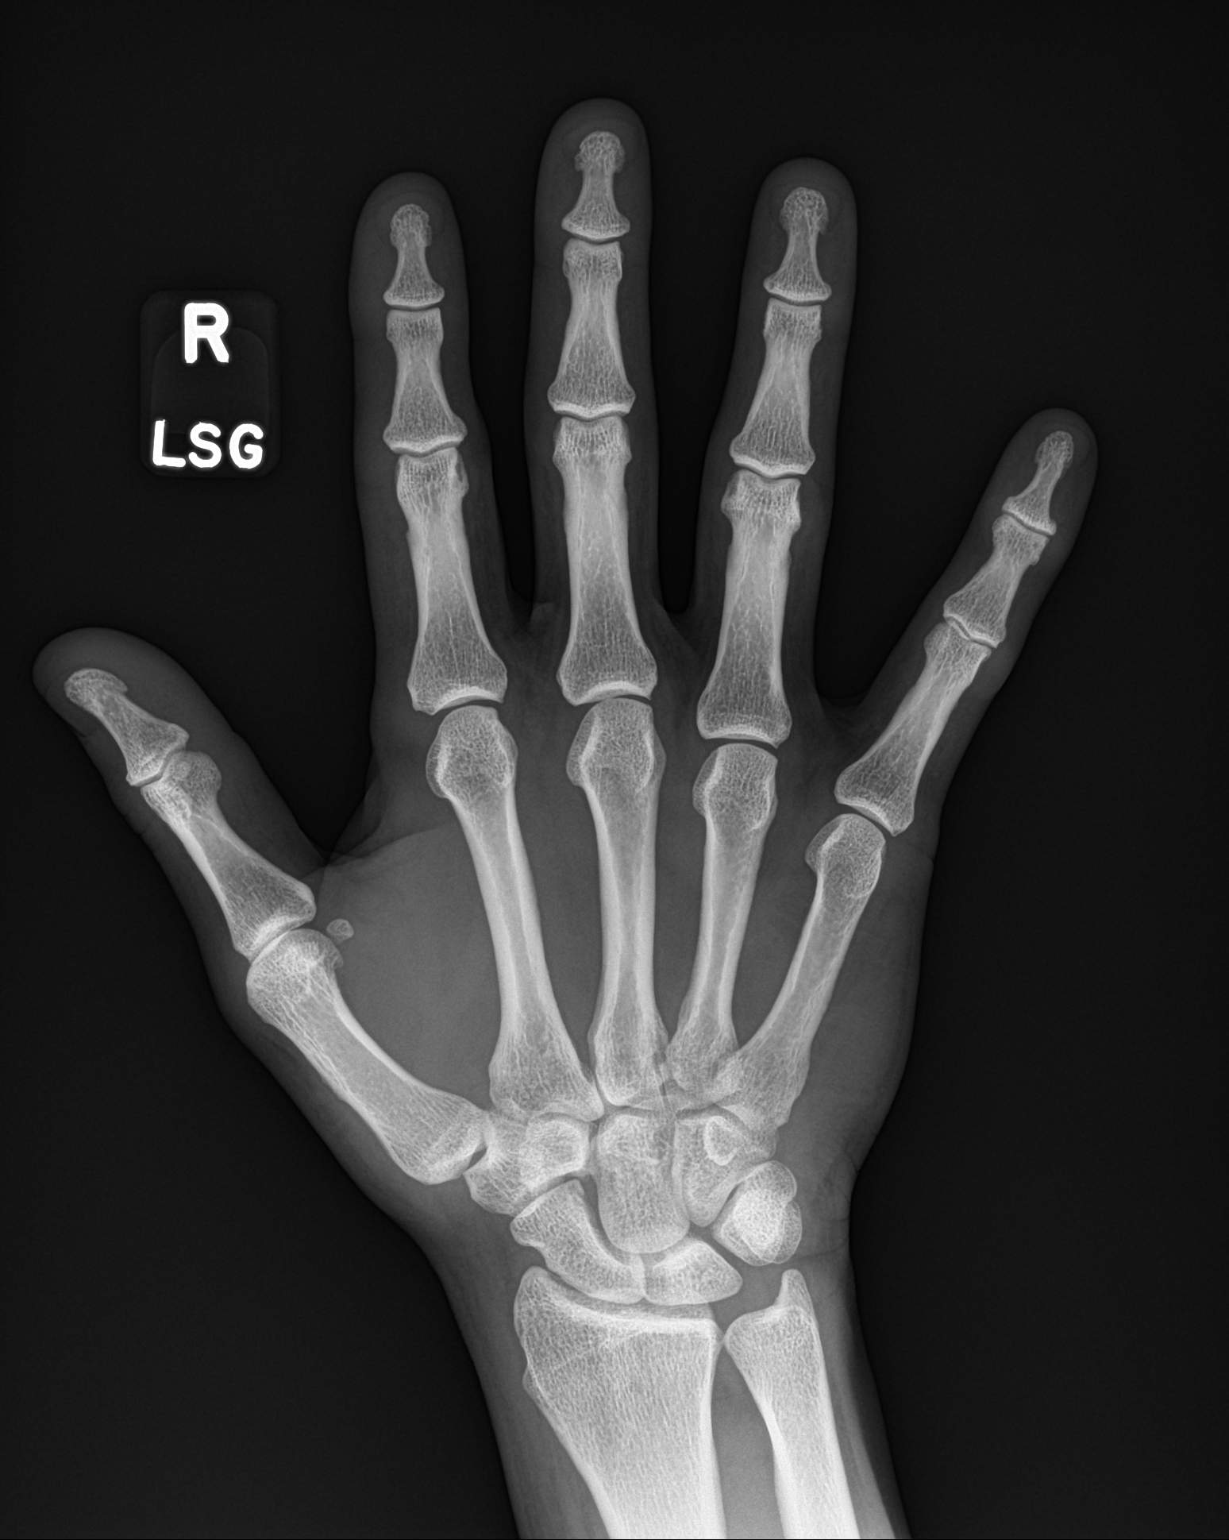

[hand obl]
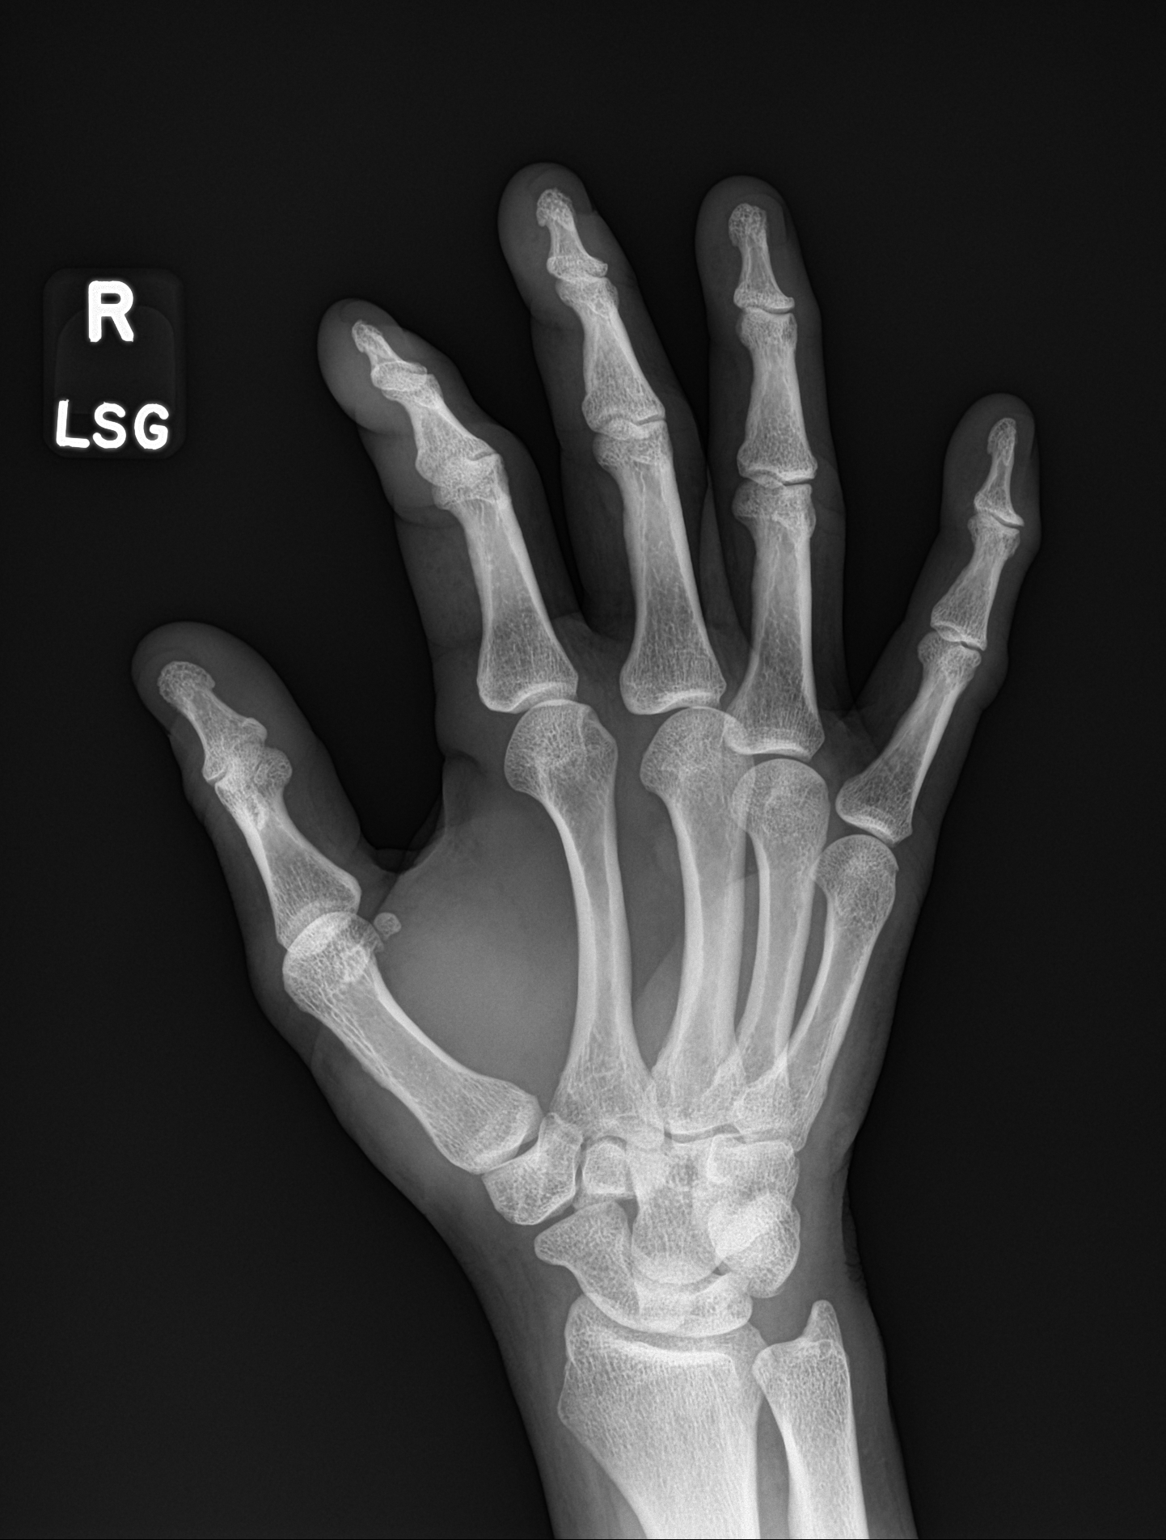

[hand lat]
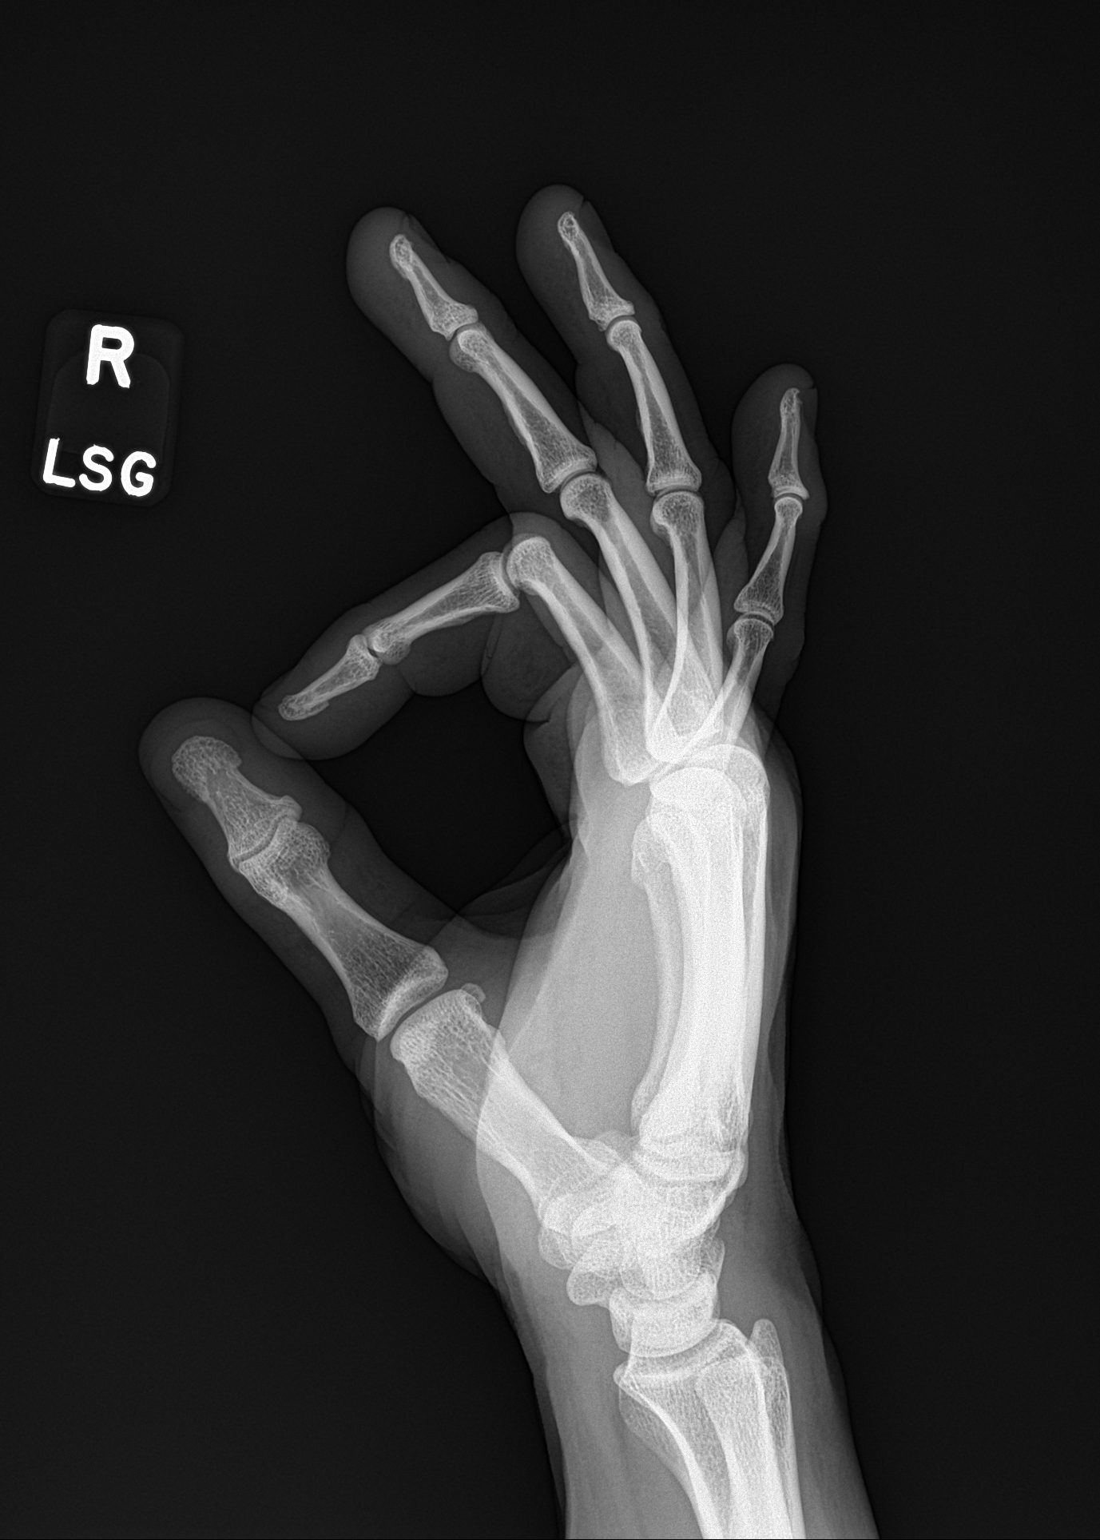

[3 of 3 positions shown; findings below may reference images not displayed]

FINDINGS: There is no evidence of fracture or dislocation. There is no
evidence of arthropathy or other focal bone abnormality. Soft
tissues are unremarkable.
IMPRESSION: Negative.

## 2019-05-17 ENCOUNTER — Encounter: Payer: Self-pay | Admitting: Primary Care

## 2019-05-17 ENCOUNTER — Telehealth: Payer: Self-pay

## 2019-05-17 ENCOUNTER — Other Ambulatory Visit (INDEPENDENT_AMBULATORY_CARE_PROVIDER_SITE_OTHER): Payer: BC Managed Care – PPO

## 2019-05-17 ENCOUNTER — Other Ambulatory Visit: Payer: Self-pay

## 2019-05-17 ENCOUNTER — Ambulatory Visit: Payer: BC Managed Care – PPO | Admitting: Primary Care

## 2019-05-17 VITALS — BP 110/78 | HR 90 | Temp 97.8°F | Ht 71.0 in | Wt 262.5 lb

## 2019-05-17 DIAGNOSIS — G8929 Other chronic pain: Secondary | ICD-10-CM | POA: Insufficient documentation

## 2019-05-17 DIAGNOSIS — M2550 Pain in unspecified joint: Secondary | ICD-10-CM

## 2019-05-17 DIAGNOSIS — M542 Cervicalgia: Secondary | ICD-10-CM | POA: Diagnosis not present

## 2019-05-17 DIAGNOSIS — D582 Other hemoglobinopathies: Secondary | ICD-10-CM

## 2019-05-17 DIAGNOSIS — M79643 Pain in unspecified hand: Secondary | ICD-10-CM | POA: Diagnosis not present

## 2019-05-17 DIAGNOSIS — M720 Palmar fascial fibromatosis [Dupuytren]: Secondary | ICD-10-CM | POA: Diagnosis not present

## 2019-05-17 LAB — COMPREHENSIVE METABOLIC PANEL
ALT: 35 U/L (ref 0–53)
AST: 24 U/L (ref 0–37)
Albumin: 4.7 g/dL (ref 3.5–5.2)
Alkaline Phosphatase: 56 U/L (ref 39–117)
BUN: 16 mg/dL (ref 6–23)
CO2: 25 mEq/L (ref 19–32)
Calcium: 10 mg/dL (ref 8.4–10.5)
Chloride: 101 mEq/L (ref 96–112)
Creatinine, Ser: 1.21 mg/dL (ref 0.40–1.50)
GFR: 66.22 mL/min (ref >60.00)
Glucose, Bld: 99 mg/dL (ref 70–99)
Potassium: 4.7 mEq/L (ref 3.5–5.1)
Sodium: 139 mEq/L (ref 135–145)
Total Bilirubin: 0.7 mg/dL (ref 0.2–1.2)
Total Protein: 7.2 g/dL (ref 6.0–8.3)

## 2019-05-17 LAB — WHITE CELL DIFFERENTIAL
Basophils Relative: 0.6 % (ref 0.0–3.0)
Eosinophils Relative: 2.1 % (ref 0.0–5.0)
Lymphocytes Relative: 40.1 % (ref 12.0–46.0)
Monocytes Relative: 8.4 % (ref 3.0–12.0)
Neutrophils Relative %: 48.8 % (ref 43.0–77.0)

## 2019-05-17 LAB — CBC
HCT: 54.1 % — ABNORMAL HIGH (ref 39.0–52.0)
Hemoglobin: 18.4 g/dL (ref 13.0–17.0)
MCHC: 34 g/dL (ref 30.0–36.0)
MCV: 89.3 fl (ref 78.0–100.0)
Platelets: 262 10*3/uL (ref 150.0–400.0)
RBC: 6.05 Mil/uL — ABNORMAL HIGH (ref 4.22–5.81)
RDW: 13.6 % (ref 11.5–15.5)
WBC: 7.3 10*3/uL (ref 4.0–10.5)

## 2019-05-17 LAB — C-REACTIVE PROTEIN: CRP: <1 mg/dL (ref 0.5–20.0)

## 2019-05-17 LAB — SEDIMENTATION RATE: Sed Rate: 3 mm/hr (ref 0–15)

## 2019-05-17 NOTE — Assessment & Plan Note (Signed)
Notified by chiropractor that arthritis to neck has advanced rapidly. Referral placed to orthopedics for further evaluation.  No obvious alarm signs today.

## 2019-05-17 NOTE — Addendum Note (Signed)
Addended by: Cloyd Stagers on: 05/17/2019 02:08 PM   Modules accepted: Orders

## 2019-05-17 NOTE — Patient Instructions (Addendum)
Stop by the lab prior to leaving today. I will notify you of your results once received.   You will be contacted regarding your referral to orthopedics.  Please let us know if you have not been contacted within one week.    You can try Voltaren Gel to help with discomfort/pain. This can be purchased over the counter.   It was a pleasure to see you today!

## 2019-05-17 NOTE — Progress Notes (Signed)
Subjective:    Patient ID: Arthur Ford, male    DOB: 04/08/1981, 38 y.o.   MRN: [id number]  HPI  Arthur Ford is a 38 year old male with a history of chronic joint pain/arthralgias, dupuytren's contracture, testosterone deficiency who presents today with a chief complaint of arthralgias.  Evaluated in September 2019 for complaints of increased chronic pain to hands and knees, also mentioning neck and lower back pain, felt that knuckles were enlarging. 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 During this visit he was encouraged to continue Tumeric and glucosamine, reduce repetitive work with hands, follow up as needed.  Since his last visit he continues to experience chronic joint pain his hands. He also continues to suffer from dupuytren's contracture to the right hand, has seen several specialists. 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 Also has a 1.5 hour drive each way. 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 Has noticed decrease in ROM to hands with some weakness, cannot open jars, cannot work out or lift weights due to lack of hand grip. He is taking Ibuprofen, Tylenol, Icy Hot, glucosamine infrequently without improvement.   He is seeing his chiropractor often who is working with his chronic neck, back, and hand pain. Is undergoing "cupping" to the right hand for his contracture with some improvement. He underwent plain films to the cervical spine last week, was told that his arthritis was progressing rapidly and recommended rheumatology/orthopedic evaluation. He has xrays of his neck today.  Review of Systems  Constitutional: Negative for fever.  Musculoskeletal: Positive for arthralgias and joint swelling.  Skin: Positive for color change. Negative for rash.  Neurological: Positive for weakness.       Past Medical History:   Diagnosis Date  . Blood in stool   . Polycythemia   . Separation anxiety   . Testosterone deficiency   . Tinnitus      Social History   Socioeconomic History  . Marital status: Single    Spouse name: Not on file  . Number of children: Not on file  . Years of education: Not on file  . Highest education level: Not on file  Occupational History  . Not on file  Social Needs  . Financial resource strain: Not on file  . Food insecurity    Worry: Not on file    Inability: Not on file  . Transportation needs    Medical: Not on file    Non-medical: Not on file  Tobacco Use  . Smoking status: Light Tobacco Smoker    Types: Cigars  . Smokeless tobacco: Never Used  Substance and Sexual Activity  . Alcohol use: Yes    Alcohol/week: 0.0 standard drinks    Comment: soical  . Drug use: No  . Sexual activity: Not on file  Lifestyle  . Physical activity    Days per week: Not on file    Minutes per session: Not on file  . Stress: Not on file  Relationships  . Social Herbalist on phone: Not on file    Gets together: Not on file    Attends religious service: Not on file    Active member of club or organization: Not on file    Attends meetings of clubs or organizations: Not on file  Relationship status: Not on file  . Intimate partner violence    Fear of current or ex partner: Not on file    Emotionally abused: Not on file    Physically abused: Not on file    Forced sexual activity: Not on file  Other Topics Concern  . Not on file  Social History Narrative   Divorced.   2 children.   Works as a Engineer, mining.   Enjoys spending time with girlfriend.     Past Surgical History:  Procedure Laterality Date  . TESTICLE REMOVAL      Family History  Problem Relation Age of Onset  . Ovarian cancer Mother   . Stroke Mother   . Hypertension Mother   . Kidney disease Mother   . Diabetes Mother   . Alcohol abuse Father   . Stroke Father   . Hypertension  Father   . Heart disease Paternal Grandfather     No Known Allergies  Current Outpatient Medications on File Prior to Visit  Medication Sig Dispense Refill  . ibuprofen (ADVIL,MOTRIN) 200 MG tablet Take 200 mg by mouth every 6 (six) hours as needed.    . Multiple Vitamin (MULTIVITAMIN) capsule Take 1 capsule by mouth daily.    Marland Kitchen NEEDLE, DISP, 22 G 22G X 1-1/2" MISC by Does not apply route.    . Testosterone Cypionate 100 MG/ML SOLN Inject 0.5 mLs into the muscle.      No current facility-administered medications on file prior to visit.     BP 110/78   Pulse 90   Temp 97.8 F (36.6 C) (Temporal)   Ht 5\' 11"  (1.803 m)   Wt 262 lb 8 oz (119.1 kg)   SpO2 98%   BMI 36.61 kg/m    Objective:   Physical Exam  Constitutional: He appears well-nourished.  Neck: Neck supple.    Generalized decrease in ROM due to discomfort, extension and flexion.  Cardiovascular: Normal rate and regular rhythm.  Respiratory: Effort normal and breath sounds normal.  Musculoskeletal:     Comments: Obviously enlarged DIP and PIP joints bilaterally without acute swelling/erythema.  Dupuytren's contracture to right hand noted.  Skin: Skin is warm and dry. No erythema.  Psychiatric: He has a normal mood and affect.           Assessment & Plan:

## 2019-05-17 NOTE — Telephone Encounter (Signed)
[facility] called critical results @ 1047  Hemoglobin 18.4

## 2019-05-17 NOTE — Assessment & Plan Note (Signed)
Check labs today for other inflammatory cause. Suspect more osteoarthritis given long years of working with hands. Recommended Voltaren Gel PRN. Await results.

## 2019-05-17 NOTE — Assessment & Plan Note (Signed)
Obvious to right hand. Referral to orthopedics placed.

## 2019-05-17 NOTE — Telephone Encounter (Signed)
Noted. Tam, can we add on CMP to labs that were drawn?

## 2019-05-18 ENCOUNTER — Other Ambulatory Visit: Payer: Self-pay | Admitting: Primary Care

## 2019-05-18 DIAGNOSIS — D751 Secondary polycythemia: Secondary | ICD-10-CM

## 2019-05-18 LAB — CYCLIC CITRUL PEPTIDE ANTIBODY, IGG: Cyclic Citrullin Peptide Ab: <16 UNITS

## 2019-05-18 LAB — PATHOLOGIST SMEAR REVIEW

## 2019-05-18 LAB — RHEUMATOID FACTOR: Rhuematoid fact SerPl-aCnc: <14 IU/mL (ref <14)

## 2019-05-31 DIAGNOSIS — M720 Palmar fascial fibromatosis [Dupuytren]: Secondary | ICD-10-CM | POA: Diagnosis not present

## 2019-06-13 DIAGNOSIS — E291 Testicular hypofunction: Secondary | ICD-10-CM | POA: Diagnosis not present

## 2019-06-13 DIAGNOSIS — Z125 Encounter for screening for malignant neoplasm of prostate: Secondary | ICD-10-CM | POA: Diagnosis not present

## 2019-06-20 DIAGNOSIS — E291 Testicular hypofunction: Secondary | ICD-10-CM | POA: Diagnosis not present

## 2019-06-20 DIAGNOSIS — N521 Erectile dysfunction due to diseases classified elsewhere: Secondary | ICD-10-CM | POA: Diagnosis not present

## 2019-06-20 DIAGNOSIS — M720 Palmar fascial fibromatosis [Dupuytren]: Secondary | ICD-10-CM | POA: Diagnosis not present

## 2019-06-22 DIAGNOSIS — Z79890 Hormone replacement therapy (postmenopausal): Secondary | ICD-10-CM | POA: Diagnosis not present

## 2019-06-22 DIAGNOSIS — Z20828 Contact with and (suspected) exposure to other viral communicable diseases: Secondary | ICD-10-CM | POA: Diagnosis not present

## 2019-06-22 DIAGNOSIS — M79641 Pain in right hand: Secondary | ICD-10-CM | POA: Diagnosis not present

## 2019-06-22 DIAGNOSIS — M720 Palmar fascial fibromatosis [Dupuytren]: Secondary | ICD-10-CM | POA: Diagnosis not present

## 2019-06-22 DIAGNOSIS — G8918 Other acute postprocedural pain: Secondary | ICD-10-CM | POA: Diagnosis not present

## 2019-06-28 DIAGNOSIS — M720 Palmar fascial fibromatosis [Dupuytren]: Secondary | ICD-10-CM | POA: Diagnosis not present

## 2019-07-07 DIAGNOSIS — Z20828 Contact with and (suspected) exposure to other viral communicable diseases: Secondary | ICD-10-CM | POA: Diagnosis not present

## 2019-07-07 DIAGNOSIS — M720 Palmar fascial fibromatosis [Dupuytren]: Secondary | ICD-10-CM | POA: Diagnosis not present

## 2019-07-21 DIAGNOSIS — M720 Palmar fascial fibromatosis [Dupuytren]: Secondary | ICD-10-CM | POA: Diagnosis not present

## 2019-08-04 DIAGNOSIS — M720 Palmar fascial fibromatosis [Dupuytren]: Secondary | ICD-10-CM | POA: Diagnosis not present

## 2019-08-16 DIAGNOSIS — M720 Palmar fascial fibromatosis [Dupuytren]: Secondary | ICD-10-CM | POA: Diagnosis not present

## 2019-09-29 DIAGNOSIS — G5601 Carpal tunnel syndrome, right upper limb: Secondary | ICD-10-CM | POA: Diagnosis not present

## 2019-11-03 DIAGNOSIS — G5601 Carpal tunnel syndrome, right upper limb: Secondary | ICD-10-CM | POA: Diagnosis not present

## 2019-11-03 DIAGNOSIS — G5602 Carpal tunnel syndrome, left upper limb: Secondary | ICD-10-CM | POA: Diagnosis not present

## 2019-11-03 DIAGNOSIS — G5622 Lesion of ulnar nerve, left upper limb: Secondary | ICD-10-CM | POA: Diagnosis not present

## 2019-11-03 DIAGNOSIS — G5621 Lesion of ulnar nerve, right upper limb: Secondary | ICD-10-CM | POA: Diagnosis not present

## 2019-11-10 DIAGNOSIS — E291 Testicular hypofunction: Secondary | ICD-10-CM | POA: Diagnosis not present

## 2019-11-10 DIAGNOSIS — Z125 Encounter for screening for malignant neoplasm of prostate: Secondary | ICD-10-CM | POA: Diagnosis not present

## 2019-11-13 ENCOUNTER — Ambulatory Visit: Payer: BC Managed Care – PPO | Attending: Internal Medicine

## 2019-11-13 DIAGNOSIS — Z23 Encounter for immunization: Secondary | ICD-10-CM

## 2019-11-13 NOTE — Progress Notes (Signed)
   Covid-19 Vaccination Clinic  Name:  Coral Soler    MRN: [id number] DOB: 11-17-1980  11/13/2019  Mr. Bolds was observed post Covid-19 immunization for 15 minutes without incident. He was provided with Vaccine Information Sheet and instruction to access the V-Safe system.   Mr. Verba was instructed to call 911 with any severe reactions post vaccine: Marland Kitchen Difficulty breathing  . Swelling of face and throat  . A fast heartbeat  . A bad rash all over body  . Dizziness and weakness   Immunizations Administered    Name Date Dose VIS Date Route   Pfizer COVID-19 Vaccine 11/13/2019 10:04 AM 0.3 mL 07/29/2019 Intramuscular   Manufacturer: [employer], [employer]   Lot: [id number]   NDC: [address]-2

## 2019-11-17 ENCOUNTER — Other Ambulatory Visit: Payer: Self-pay

## 2019-11-17 ENCOUNTER — Ambulatory Visit
Admission: EM | Admit: 2019-11-17 | Discharge: 2019-11-17 | Disposition: A | Discharge status: Home / Self Care | Payer: BC Managed Care – PPO | Attending: Emergency Medicine | Admitting: Emergency Medicine

## 2019-11-17 ENCOUNTER — Encounter: Payer: Self-pay | Admitting: Emergency Medicine

## 2019-11-17 DIAGNOSIS — J069 Acute upper respiratory infection, unspecified: Secondary | ICD-10-CM | POA: Diagnosis not present

## 2019-11-17 DIAGNOSIS — E291 Testicular hypofunction: Secondary | ICD-10-CM | POA: Diagnosis not present

## 2019-11-17 DIAGNOSIS — N521 Erectile dysfunction due to diseases classified elsewhere: Secondary | ICD-10-CM | POA: Diagnosis not present

## 2019-11-17 DIAGNOSIS — Q550 Absence and aplasia of testis: Secondary | ICD-10-CM | POA: Diagnosis not present

## 2019-11-17 LAB — POC SARS CORONAVIRUS 2 AG -  ED: SARS Coronavirus 2 Ag: NEGATIVE

## 2019-11-17 LAB — POCT RAPID STREP A (OFFICE): Rapid Strep A Screen: NEGATIVE

## 2019-11-17 NOTE — ED Provider Notes (Signed)
Arthur Ford    CSN: [id number] Arrival date & time: 11/17/19  1336      History   Chief Complaint Chief Complaint  Patient presents with  . Sore Throat  . Facial Pain    HPI Arthur Ford is a 39 y.o. male.   Patient presents with 2-day history of sore throat, sinus pressure, ear pain, sinus congestion.  He had his Covid vaccine on 11/13/2019.  He has been treating his symptoms at home with Benadryl.  He denies fever, chills, shortness of breath, vomiting, diarrhea, rash, or other symptoms.  The history is provided by the patient.    Past Medical History:  Diagnosis Date  . Blood in stool   . Polycythemia   . Separation anxiety   . Testosterone deficiency   . Tinnitus     Patient Active Problem List   Diagnosis Date Noted  . Chronic hand pain 05/17/2019  . Chronic neck pain 05/17/2019  . Dupuytren contracture 04/23/2018  . Osteoarthritis 04/23/2018  . Nasal congestion 04/23/2018  . Lower back pain 10/26/2017  . Testosterone deficiency 10/29/2015  . Separation anxiety 10/29/2015  . Tinnitus 10/29/2015    Past Surgical History:  Procedure Laterality Date  . TESTICLE REMOVAL         Home Medications    Prior to Admission medications   Medication Sig Start Date End Date Taking? Authorizing Provider  ibuprofen (ADVIL,MOTRIN) 200 MG tablet Take 200 mg by mouth every 6 (six) hours as needed.    [provider]  Multiple Vitamin (MULTIVITAMIN) capsule Take 1 capsule by mouth daily.    [provider]  NEEDLE, DISP, 22 G 22G X 1-1/2" MISC by Does not apply route. 04/04/15   [provider]  Testosterone Cypionate 100 MG/ML SOLN Inject 0.5 mLs into the muscle.     [provider]    Family History Family History  Problem Relation Age of Onset  . Ovarian cancer Mother   . Stroke Mother   . Hypertension Mother   . Kidney disease Mother   . Diabetes Mother   . Alcohol abuse Father   . Stroke Father   .  Hypertension Father   . Heart disease Paternal Grandfather     Social History Social History   Tobacco Use  . Smoking status: Light Tobacco Smoker    Types: Cigars  . Smokeless tobacco: Never Used  Substance Use Topics  . Alcohol use: Yes    Alcohol/week: 0.0 standard drinks    Comment: soical  . Drug use: No     Allergies   Patient has no known allergies.   Review of Systems Review of Systems  Constitutional: Negative for chills and fever.  HENT: Positive for congestion, ear pain, sinus pressure and sore throat.   Eyes: Negative for pain and visual disturbance.  Respiratory: Negative for cough and shortness of breath.   Cardiovascular: Negative for chest pain and palpitations.  Gastrointestinal: Negative for abdominal pain, diarrhea, nausea and vomiting.  Genitourinary: Negative for dysuria and hematuria.  Musculoskeletal: Negative for arthralgias and back pain.  Skin: Negative for color change and rash.  Neurological: Negative for seizures and syncope.  All other systems reviewed and are negative.    Physical Exam Triage Vital Signs ED Triage Vitals  Enc Vitals Group     BP      Pulse      Resp      Temp      Temp src  SpO2      Weight      Height      Head Circumference      Peak Flow      Pain Score      Pain Loc      Pain Edu?      Excl. in GC?    No data found.  Updated Vital Signs Wt 250 lb (113.4 kg)   BMI 34.87 kg/m   Visual Acuity Right Eye Distance:   Left Eye Distance:   Bilateral Distance:    Right Eye Near:   Left Eye Near:    Bilateral Near:     Physical Exam Vitals and nursing note reviewed.  Constitutional:      General: He is not in acute distress.    Appearance: He is well-developed.  HENT:     Head: Normocephalic and atraumatic.     Right Ear: Tympanic membrane normal.     Left Ear: Tympanic membrane normal.     Nose: Congestion present.     Mouth/Throat:     Mouth: Mucous membranes are moist.     Pharynx:  Oropharynx is clear.  Eyes:     Conjunctiva/sclera: Conjunctivae normal.  Cardiovascular:     Rate and Rhythm: Normal rate and regular rhythm.     Heart sounds: No murmur.  Pulmonary:     Effort: Pulmonary effort is normal. No respiratory distress.     Breath sounds: Normal breath sounds. No wheezing or rhonchi.  Abdominal:     General: Bowel sounds are normal.     Palpations: Abdomen is soft.     Tenderness: There is no abdominal tenderness. There is no guarding or rebound.  Musculoskeletal:     Cervical back: Neck supple.  Skin:    General: Skin is warm and dry.     Findings: No rash.  Neurological:     General: No focal deficit present.     Mental Status: He is alert and oriented to person, place, and time.  Psychiatric:        Mood and Affect: Mood normal.        Behavior: Behavior normal.      UC Treatments / Results  Labs (all labs ordered are listed, but only abnormal results are displayed) Labs Reviewed  CULTURE, GROUP A STREP ([facility])  NOVEL CORONAVIRUS, NAA  POCT RAPID STREP A (OFFICE)  POC SARS CORONAVIRUS 2 AG -  ED    EKG   Radiology No results found.  Procedures Procedures (including critical care time)  Medications Ordered in UC Medications - No data to display  Initial Impression / Assessment and Plan / UC Course  I have reviewed the triage vital signs and the nursing notes.  Pertinent labs & imaging results that were available during my care of the patient were reviewed by me and considered in my medical decision making (see chart for details).   Upper respiratory infection.  Treating with OTC ibuprofen and Mucinex.  Rapid strep negative; throat culture pending. POC COVID negative; PCR pending.  Instructed patient to self quarantine until the test result is back.  Instructed patient to go to the emergency department if he develops high fever, shortness of breath, severe vomiting/diarrhea, or other concerning symptoms.  Patient agrees with plan of  care.      Final Clinical Impressions(s) / UC Diagnoses   Final diagnoses:  Upper respiratory tract infection, unspecified type     Discharge Instructions     Take ibuprofen  and plain over-the-counter Mucinex as needed for your symptoms.  Follow up with your primary care provider if your symptoms are not improving.   Your rapid strep test is negative.  A throat culture is pending; we will call you if it is positive requiring treatment.    Your rapid COVID test is negative; the send-out test is pending.  You should self quarantine until your test result is back and is negative.    Go to the emergency department if you develop high fever, shortness of breath, severe diarrhea, or other concerning symptoms.       ED Prescriptions    None     PDMP not reviewed this encounter.   Mickie Bail, NP 11/17/19 1424

## 2019-11-17 NOTE — Discharge Instructions (Addendum)
Take ibuprofen and plain over-the-counter Mucinex as needed for your symptoms.  Follow up with your primary care provider if your symptoms are not improving.   Your rapid strep test is negative.  A throat culture is pending; we will call you if it is positive requiring treatment.    Your rapid COVID test is negative; the send-out test is pending.  You should self quarantine until your test result is back and is negative.    Go to the emergency department if you develop high fever, shortness of breath, severe diarrhea, or other concerning symptoms.

## 2019-11-17 NOTE — ED Triage Notes (Signed)
Patient in office [facility] c/o sore throat facial pain , Ear ache sx started 11/15/19  Denies:Fever,n/v  [id number]

## 2019-11-19 LAB — CULTURE, GROUP A STREP (THRC)

## 2019-11-19 LAB — SARS-COV-2, NAA 2 DAY TAT

## 2019-11-19 LAB — NOVEL CORONAVIRUS, NAA: SARS-CoV-2, NAA: NOT DETECTED

## 2019-11-23 DIAGNOSIS — G5603 Carpal tunnel syndrome, bilateral upper limbs: Secondary | ICD-10-CM | POA: Diagnosis not present

## 2019-11-29 DIAGNOSIS — G5603 Carpal tunnel syndrome, bilateral upper limbs: Secondary | ICD-10-CM | POA: Diagnosis not present

## 2019-12-07 ENCOUNTER — Ambulatory Visit: Payer: BC Managed Care – PPO | Attending: Internal Medicine

## 2019-12-07 DIAGNOSIS — Z23 Encounter for immunization: Secondary | ICD-10-CM

## 2019-12-07 NOTE — Progress Notes (Signed)
   Covid-19 Vaccination Clinic  Name:  Arthur Ford    MRN: [id number] DOB: 01-18-1981  12/07/2019  [name] was observed post Covid-19 immunization for 15 minutes without incident. He was provided with Vaccine Information Sheet and instruction to access the V-Safe system.   [name] was instructed to call 911 with any severe reactions post vaccine: Marland Kitchen Difficulty breathing  . Swelling of face and throat  . A fast heartbeat  . A bad rash all over body  . Dizziness and weakness   Immunizations Administered    Name Date Dose VIS Date Route   Pfizer COVID-19 Vaccine 12/07/2019  4:41 PM 0.3 mL 10/12/2018 Intramuscular   Manufacturer: [employer], [employer]   Lot: [id number]   NDC: [address]-1

## 2020-01-10 DIAGNOSIS — G5621 Lesion of ulnar nerve, right upper limb: Secondary | ICD-10-CM | POA: Diagnosis not present

## 2020-01-10 DIAGNOSIS — G5601 Carpal tunnel syndrome, right upper limb: Secondary | ICD-10-CM | POA: Diagnosis not present

## 2020-01-25 DIAGNOSIS — Z20822 Contact with and (suspected) exposure to covid-19: Secondary | ICD-10-CM | POA: Diagnosis not present

## 2020-01-25 DIAGNOSIS — Z01812 Encounter for preprocedural laboratory examination: Secondary | ICD-10-CM | POA: Diagnosis not present

## 2020-01-27 DIAGNOSIS — G5621 Lesion of ulnar nerve, right upper limb: Secondary | ICD-10-CM | POA: Diagnosis not present

## 2020-01-27 DIAGNOSIS — G8918 Other acute postprocedural pain: Secondary | ICD-10-CM | POA: Diagnosis not present

## 2020-01-27 DIAGNOSIS — E669 Obesity, unspecified: Secondary | ICD-10-CM | POA: Diagnosis not present

## 2020-01-27 DIAGNOSIS — Z6834 Body mass index (BMI) 34.0-34.9, adult: Secondary | ICD-10-CM | POA: Diagnosis not present

## 2020-01-27 DIAGNOSIS — M25531 Pain in right wrist: Secondary | ICD-10-CM | POA: Diagnosis not present

## 2020-01-27 DIAGNOSIS — G5601 Carpal tunnel syndrome, right upper limb: Secondary | ICD-10-CM | POA: Diagnosis not present

## 2020-05-15 DIAGNOSIS — Z125 Encounter for screening for malignant neoplasm of prostate: Secondary | ICD-10-CM | POA: Diagnosis not present

## 2020-05-15 DIAGNOSIS — E291 Testicular hypofunction: Secondary | ICD-10-CM | POA: Diagnosis not present

## 2020-05-22 DIAGNOSIS — E291 Testicular hypofunction: Secondary | ICD-10-CM | POA: Diagnosis not present

## 2020-11-29 DIAGNOSIS — E291 Testicular hypofunction: Secondary | ICD-10-CM | POA: Diagnosis not present

## 2020-11-29 DIAGNOSIS — Z125 Encounter for screening for malignant neoplasm of prostate: Secondary | ICD-10-CM | POA: Diagnosis not present

## 2020-12-10 DIAGNOSIS — G4726 Circadian rhythm sleep disorder, shift work type: Secondary | ICD-10-CM | POA: Diagnosis not present

## 2020-12-10 DIAGNOSIS — R413 Other amnesia: Secondary | ICD-10-CM | POA: Diagnosis not present

## 2020-12-10 DIAGNOSIS — F39 Unspecified mood [affective] disorder: Secondary | ICD-10-CM | POA: Diagnosis not present

## 2020-12-10 DIAGNOSIS — G479 Sleep disorder, unspecified: Secondary | ICD-10-CM | POA: Diagnosis not present

## 2020-12-11 DIAGNOSIS — E291 Testicular hypofunction: Secondary | ICD-10-CM | POA: Diagnosis not present

## 2020-12-11 DIAGNOSIS — N521 Erectile dysfunction due to diseases classified elsewhere: Secondary | ICD-10-CM | POA: Diagnosis not present

## 2020-12-11 DIAGNOSIS — Z125 Encounter for screening for malignant neoplasm of prostate: Secondary | ICD-10-CM | POA: Diagnosis not present

## 2020-12-11 DIAGNOSIS — Q550 Absence and aplasia of testis: Secondary | ICD-10-CM | POA: Diagnosis not present

## 2020-12-11 LAB — PSA: PSA: 0.8

## 2020-12-18 ENCOUNTER — Encounter: Payer: Self-pay | Admitting: Primary Care

## 2020-12-18 ENCOUNTER — Other Ambulatory Visit: Payer: Self-pay

## 2021-08-20 ENCOUNTER — Other Ambulatory Visit: Payer: Self-pay

## 2021-08-20 ENCOUNTER — Ambulatory Visit: Payer: Managed Care, Other (non HMO) | Admitting: Primary Care

## 2021-08-20 ENCOUNTER — Telehealth: Payer: Self-pay | Admitting: *Deleted

## 2021-08-20 ENCOUNTER — Encounter: Payer: Self-pay | Admitting: Primary Care

## 2021-08-20 VITALS — BP 150/94 | HR 96 | Temp 98.0°F | Ht 71.0 in | Wt 272.0 lb

## 2021-08-20 DIAGNOSIS — R55 Syncope and collapse: Secondary | ICD-10-CM | POA: Diagnosis not present

## 2021-08-20 DIAGNOSIS — I10 Essential (primary) hypertension: Secondary | ICD-10-CM

## 2021-08-20 DIAGNOSIS — E349 Endocrine disorder, unspecified: Secondary | ICD-10-CM

## 2021-08-20 HISTORY — DX: Syncope and collapse: R55

## 2021-08-20 LAB — BASIC METABOLIC PANEL
BUN: 12 mg/dL (ref 6–23)
CO2: 29 mEq/L (ref 19–32)
Calcium: 9.6 mg/dL (ref 8.4–10.5)
Chloride: 102 mEq/L (ref 96–112)
Creatinine, Ser: 1.16 mg/dL (ref 0.40–1.50)
GFR: 77.5 mL/min (ref >60.00)
Glucose, Bld: 89 mg/dL (ref 70–99)
Potassium: 4.2 mEq/L (ref 3.5–5.1)
Sodium: 139 mEq/L (ref 135–145)

## 2021-08-20 LAB — CBC
HCT: 52.4 % — ABNORMAL HIGH (ref 39.0–52.0)
Hemoglobin: 17.5 g/dL — ABNORMAL HIGH (ref 13.0–17.0)
MCHC: 33.5 g/dL (ref 30.0–36.0)
MCV: 91.9 fl (ref 78.0–100.0)
Platelets: 261 10*3/uL (ref 150.0–400.0)
RBC: 5.7 Mil/uL (ref 4.22–5.81)
RDW: 12.8 % (ref 11.5–15.5)
WBC: 5.8 10*3/uL (ref 4.0–10.5)

## 2021-08-20 MED ORDER — HYDROCHLOROTHIAZIDE 12.5 MG PO TABS: 12.5000 mg | ORAL_TABLET | Freq: Every day | ORAL | 0 refills | Status: DC

## 2021-08-20 NOTE — Telephone Encounter (Signed)
Patient evaluated.  

## 2021-08-20 NOTE — Assessment & Plan Note (Signed)
Above goal today, also with home readings and another documented reading in EMR.  Given symptoms, coupled with readings and family history, will treat.  Rx for HCTZ 12.5 mg sent to pharmacy. Checking BMP and CBC today.  We will plan to see him back in 2 weeks for BP check.

## 2021-08-20 NOTE — Telephone Encounter (Signed)
Patient scheduled for an appointment today 08/20/21 with Mayra Reel NP at 12:00 .

## 2021-08-20 NOTE — Patient Instructions (Signed)
Stop by the lab prior to leaving today. I will notify you of your results once received.   Start hydrochlorothiazide 12.5 mg once daily for high blood pressure.  You will be contacted regarding your referral to cardiology.  Please let us know if you have not been contacted within two weeks.   See you in a few weeks!

## 2021-08-20 NOTE — Assessment & Plan Note (Signed)
Peculiar case, unclear etiology as he does have a history of such years ago.   Negative orthostatic vitals today. ECG and labs negative during recent ED visit, will obtain those records.  Do suspect uncontrolled hypertension to be playing some role, will treat.  Will also refer to cardiology as he may need an echocardiogram and Holter Monitor. Especially as he's never undergone work up for prior syncopal episodes.

## 2021-08-20 NOTE — Telephone Encounter (Signed)
PLEASE NOTE: All timestamps contained within this report are represented as [place] Standard Time. CONFIDENTIALTY NOTICE: This fax transmission is intended only for the addressee. It contains information that is legally privileged, confidential or otherwise protected from use or disclosure. If you are not the intended recipient, you are strictly prohibited from reviewing, disclosing, copying using or disseminating any of this information or taking any action in reliance on or regarding this information. If you have received this fax in error, please notify us immediately by telephone so that we can arrange for its return to [place]. Phone: [phone], Toll-Free: [phone], Fax: [phone] Page: 1 of 1 Call Id: [id number] [facility] Night - Client Nonclinical Telephone Record  [facility] Client [facility] Night - Client Client Site [facility] - Night Provider Vernona Rieger - NP Contact Type Call Who Is Calling Patient / Member / Family / Caregiver Caller Name Arthur Ford Caller Phone Number [phone] Patient Name Arthur Ford Patient DOB 1981/05/14 Call Type Message Only Information Provided Reason for Call Request to Schedule Office Appointment Initial Comment Caller states he would like to schedule an appointment. Patient request to speak to RN No Additional Comment Caller was advised to call back during office hours and has declined triage. Disp. Time Disposition Final User 08/19/2021 9:27:03 AM General Information Provided Yes Doug Sou Call Closed By: Doug Sou Transaction Date/Time: 08/19/2021 9:23:31 AM (ET)

## 2021-08-20 NOTE — Progress Notes (Signed)
Subjective:    Patient ID: Arthur Ford, male    DOB: 1980/12/20, 41 y.o.   MRN: [id number]  HPI  Arthur Ford is a very pleasant 41 y.o. male with a history of osteoarthritis, testosterone deficiency, chronic neck, back, and hand pain who presents today to discuss syncope and hypertension  Two weeks ago he was sitting at the barber shop, getting a hair cut and beard trim, felt the neck collar was slightly too tight, felt a cold sweat and the next thing he knew he had passed out.   He was told that both hands "curled in" during his episode but no seizure activity noted. He was out for a few minutes. When he "came to" he thought that he had just fallen asleep, believed he was dreaming. After the episode he drank some cold water, felt better, and drove himself home. It took about 3 hours before he felt "normal again".   He has a history of recurrent postural syncope that dates back to teenage years, stopped in his 12's. Never saw cardiology, just assumed that his blood pressure was dropping. He does have a family history of heart disease on both sides of the family, no know structural heart disease or arrhythmias.    He has been checking his BP at home which is running 150-160/100's. He has a family history of hypertension and heart failure in his father.   Over the last two months he's noticed intermittent headaches. He did notice redness to his left eye twice over the last two months. The redness appeared like bleeding rather than irritation. He does have chronic tinnitus, right sometimes increased.   He presented to the ED two days ago in [place] as he noticed altered peripheral vision, smart watch showing a heart rate in the 130's, decreased appetite, felt faint. During his ED visit he underwent ECG which was negative, normal troponin levels. He was treated with IV fluids. His BP was noted to be elevated. He endorses that all labs were negative. He was discharged home with recommendations  for Holter Monitor and BP treatment.   He is still managed on testosterone IM, following with Urology who monitors RBC every 6 months.  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 He is needing a letter stating that he cannot travel. He was told by the ED doctor that he should not drive.   BP Readings from Last 3 Encounters:  08/20/21 (!) 150/94  11/17/19 (!) 147/96  05/17/19 110/78      Review of Systems  Constitutional:  Positive for appetite change and fatigue.  Respiratory:  Negative for shortness of breath.   Cardiovascular:  Negative for chest pain.  Neurological:  Positive for syncope and light-headedness.       See HPI        Past Medical History:  Diagnosis Date   Blood in stool    Polycythemia    Separation anxiety    Testosterone deficiency    Tinnitus     Social History   Socioeconomic History   Marital status: Single    Spouse name: Not on file   Number of children: Not on file   Years of education: Not on file   Highest education level: Not on file  Occupational History   Not on file  Tobacco Use   Smoking status: Light Smoker  Types: Cigars   Smokeless tobacco: Never  Substance and Sexual Activity   Alcohol use: Yes    Alcohol/week: 0.0 standard drinks    Comment: soical   Drug use: No   Sexual activity: Not on file  Other Topics Concern   Not on file  Social History Narrative   Divorced.   2 children.   Works as a Engineer, mining.   Enjoys spending time with girlfriend.    Social Determinants of Health   Financial Resource Strain: Not on file  Food Insecurity: Not on file  Transportation Needs: Not on file  Physical Activity: Not on file  Stress: Not on file  Social Connections: Not on file  Intimate Partner Violence: Not on file    Past Surgical History:  Procedure Laterality Date    TESTICLE REMOVAL      Family History  Problem Relation Age of Onset   Ovarian cancer Mother    Stroke Mother    Hypertension Mother    Kidney disease Mother    Diabetes Mother    Alcohol abuse Father    Stroke Father    Hypertension Father    Heart disease Paternal Grandfather     No Known Allergies  Current Outpatient Medications on File Prior to Visit  Medication Sig Dispense Refill   ibuprofen (ADVIL,MOTRIN) 200 MG tablet Take 200 mg by mouth every 6 (six) hours as needed.     NEEDLE, DISP, 22 G 22G X 1-1/2" MISC by Does not apply route.     Testosterone Cypionate 100 MG/ML SOLN Inject 0.5 mLs into the muscle.      traZODone (DESYREL) 50 MG tablet 25 mg ( 1/2 pill) after dinner, and then 50 mg ( 1 whole pill)  at bed time.     Multiple Vitamin (MULTIVITAMIN) capsule Take 1 capsule by mouth daily. (Patient not taking: Reported on 08/20/2021)     No current facility-administered medications on file prior to visit.    BP (!) 150/94 (BP Location: Left Arm, Patient Position: Standing, Cuff Size: Normal)    Pulse 96    Temp 98 F (36.7 C) (Temporal)    Ht 5\' 11"  (1.803 m)    Wt 272 lb (123.4 kg)    SpO2 94%    BMI 37.94 kg/m  Objective:   Physical Exam Cardiovascular:     Rate and Rhythm: Normal rate and regular rhythm.  Pulmonary:     Effort: Pulmonary effort is normal.     Breath sounds: Normal breath sounds. No wheezing or rales.  Musculoskeletal:     Cervical back: Neck supple.  Skin:    General: Skin is warm and dry.  Neurological:     Mental Status: He is alert and oriented to person, place, and time.          Assessment & Plan:      This visit occurred during the SARS-CoV-2 public health emergency.  Safety protocols were in place, including screening questions prior to the visit, additional usage of staff PPE, and extensive cleaning of exam room while observing appropriate contact time as indicated for disinfecting solutions.

## 2021-08-20 NOTE — Assessment & Plan Note (Signed)
Following with Urology.  Discussed that testosterone can cause high blood pressure readings. Levels managed per Urology.

## 2021-09-04 ENCOUNTER — Encounter: Payer: Self-pay | Admitting: Primary Care

## 2021-09-04 ENCOUNTER — Other Ambulatory Visit: Payer: Self-pay | Admitting: Primary Care

## 2021-09-04 ENCOUNTER — Ambulatory Visit (INDEPENDENT_AMBULATORY_CARE_PROVIDER_SITE_OTHER): Payer: Managed Care, Other (non HMO) | Admitting: Primary Care

## 2021-09-04 ENCOUNTER — Other Ambulatory Visit: Payer: Self-pay

## 2021-09-04 VITALS — BP 136/86 | HR 87 | Temp 98.0°F | Ht 71.0 in | Wt 263.2 lb

## 2021-09-04 DIAGNOSIS — R55 Syncope and collapse: Secondary | ICD-10-CM | POA: Diagnosis not present

## 2021-09-04 DIAGNOSIS — F411 Generalized anxiety disorder: Secondary | ICD-10-CM | POA: Diagnosis not present

## 2021-09-04 DIAGNOSIS — I10 Essential (primary) hypertension: Secondary | ICD-10-CM | POA: Diagnosis not present

## 2021-09-04 LAB — BASIC METABOLIC PANEL
BUN: 22 mg/dL (ref 6–23)
CO2: 26 mEq/L (ref 19–32)
Calcium: 9.3 mg/dL (ref 8.4–10.5)
Chloride: 103 mEq/L (ref 96–112)
Creatinine, Ser: 1.22 mg/dL (ref 0.40–1.50)
GFR: 72.93 mL/min (ref >60.00)
Glucose, Bld: 109 mg/dL — ABNORMAL HIGH (ref 70–99)
Potassium: 4.2 mEq/L (ref 3.5–5.1)
Sodium: 137 mEq/L (ref 135–145)

## 2021-09-04 MED ORDER — SERTRALINE HCL 50 MG PO TABS: 50.0000 mg | ORAL_TABLET | Freq: Every day | ORAL | 0 refills | Status: DC

## 2021-09-04 MED ORDER — HYDROCHLOROTHIAZIDE 12.5 MG PO TABS: 12.5000 mg | ORAL_TABLET | Freq: Every day | ORAL | 3 refills | Status: DC

## 2021-09-04 NOTE — Assessment & Plan Note (Signed)
No episodes since prior to last visit. I did provide him with the phone number to contact cardiology for a visit.

## 2021-09-04 NOTE — Patient Instructions (Addendum)
Call [facility] in [place] at [phone] to schedule an appointment. Tell them that I placed a referral for you to be seen  Stop by the lab prior to leaving today. I will notify you of your results once received.   Start sertraline (Zoloft) 50 mg for anxiety. Take 1/2 tablet by mouth once daily for about one week, then increase to 1 full tablet thereafter.   Schedule a follow up visit with me, either virtually or in person, for 6 weeks.  It was a pleasure to see you today!

## 2021-09-04 NOTE — Assessment & Plan Note (Signed)
Acute on chronic, now unmanageable.  I do suspect anxiety has played a role in his other symptoms. He agrees. Discussed options for treatment. Referral placed for therapy. Rx for Zoloft 50 mg sent to pharmacy.  Patient is to take 1/2 tablet daily for 8 days, then advance to 1 full tablet thereafter. We discussed possible side effects of headache, GI upset, drowsiness, and SI/HI. If thoughts of SI/HI develop, we discussed to present to the emergency immediately. Patient verbalized understanding.   Follow up in 6 weeks for re-evaluation.

## 2021-09-04 NOTE — Progress Notes (Signed)
Subjective:    Patient ID: Arthur Ford, male    DOB: Nov 19, 1980, 41 y.o.   MRN: [id number]  HPI  Arthur Ford is a very pleasant 41 y.o. male with a history of testosterone deficiency, osteoarthritis, syncope, hypertension who presents today for follow up of hypertension.  He was last evaluated on 08/20/21 for an episode of syncope and hypertension. BP was noted to be above goal at that visit and also during a visit in 2021. Home readings were about the same. Given this we initiated HCTZ 12.5 mg daily, referred him to cardiology for syncope. He is here for follow up today.  Since his last visit he is compliant to HCTZ 12.5 mg daily. He is checking his BP at home and is getting readings of 120's-130's/70's-80's. He has been working on improving his diet and overall lifestyle. He is active at work.   He has yet to hear from cardiology. He continues to experience lightheadedness, feels anxious, mind racing thoughts, doesn't feel like himself. He saw a neurologist one year ago for "memory issues", was told to stop working 3rd shift.   Prior history of anxiety, overall mild symptoms and manageable, but over the last 2 months he's noticed symptoms, worse over the last 2 weeks. He's never been treated for anxiety previously.   GAD 7 : Generalized Anxiety Score 09/04/2021  Nervous, Anxious, on Edge 3  Control/stop worrying 3  Worry too much - different things 3  Trouble relaxing 3  Restless 3  Easily annoyed or irritable 1  Afraid - awful might happen 2  Total GAD 7 Score 18  Anxiety Difficulty Somewhat difficult      BP Readings from Last 3 Encounters:  09/04/21 136/86  08/20/21 (!) 150/94  11/17/19 (!) 147/96      Review of Systems  Eyes:  Negative for visual disturbance.  Respiratory:  Negative for shortness of breath.   Cardiovascular:  Negative for chest pain.  Neurological:  Positive for light-headedness and headaches.  Psychiatric/Behavioral:  The patient is  nervous/anxious.         Past Medical History:  Diagnosis Date   Blood in stool    Polycythemia    Separation anxiety    Testosterone deficiency    Tinnitus     Social History   Socioeconomic History   Marital status: Single    Spouse name: Not on file   Number of children: Not on file   Years of education: Not on file   Highest education level: Not on file  Occupational History   Not on file  Tobacco Use   Smoking status: Light Smoker    Types: Cigars   Smokeless tobacco: Never  Substance and Sexual Activity   Alcohol use: Yes    Alcohol/week: 0.0 standard drinks    Comment: soical   Drug use: No   Sexual activity: Not on file  Other Topics Concern   Not on file  Social History Narrative   Divorced.   2 children.   Works as a Leisure centre manager.   Enjoys spending time with girlfriend.    Social Determinants of Health   Financial Resource Strain: Not on file  Food Insecurity: Not on file  Transportation Needs: Not on file  Physical Activity: Not on file  Stress: Not on file  Social Connections: Not on file  Intimate Partner Violence: Not on file    Past Surgical History:  Procedure Laterality Date   TESTICLE REMOVAL      Family  History  Problem Relation Age of Onset   Ovarian cancer Mother    Stroke Mother    Hypertension Mother    Kidney disease Mother    Diabetes Mother    Alcohol abuse Father    Stroke Father    Hypertension Father    Heart disease Paternal Grandfather     No Known Allergies  Current Outpatient Medications on File Prior to Visit  Medication Sig Dispense Refill   hydrochlorothiazide (HYDRODIURIL) 12.5 MG tablet Take 1 tablet (12.5 mg total) by mouth daily. For blood pressure. 30 tablet 0   ibuprofen (ADVIL,MOTRIN) 200 MG tablet Take 200 mg by mouth every 6 (six) hours as needed.     NEEDLE, DISP, 22 G 22G X 1-1/2" MISC by Does not apply route.     Testosterone Cypionate 100 MG/ML SOLN Inject 0.5 mLs into the muscle.       traZODone (DESYREL) 50 MG tablet 25 mg ( 1/2 pill) after dinner, and then 50 mg ( 1 whole pill)  at bed time.     No current facility-administered medications on file prior to visit.    BP 136/86 (BP Location: Left Arm, Cuff Size: Large)    Pulse 87    Temp 98 F (36.7 C) (Temporal)    Ht 5\' 11"  (1.803 m)    Wt 263 lb 4 oz (119.4 kg)    SpO2 97%    BMI 36.72 kg/m  Objective:   Physical Exam Cardiovascular:     Rate and Rhythm: Normal rate and regular rhythm.  Pulmonary:     Effort: Pulmonary effort is normal.     Breath sounds: Normal breath sounds. No wheezing or rales.  Musculoskeletal:     Cervical back: Neck supple.  Skin:    General: Skin is warm and dry.  Neurological:     Mental Status: He is alert and oriented to person, place, and time.  Psychiatric:     Comments: Appears mildly anxious          Assessment & Plan:      This visit occurred during the SARS-CoV-2 public health emergency.  Safety protocols were in place, including screening questions prior to the visit, additional usage of staff PPE, and extensive cleaning of exam room while observing appropriate contact time as indicated for disinfecting solutions.

## 2021-09-04 NOTE — Assessment & Plan Note (Signed)
Improved, home readings are better than in office reading. I do suspect anxiety to be contributing today.  Continue HCTZ 12.5 mg daily. BMP pending.

## 2021-09-10 ENCOUNTER — Encounter: Payer: Self-pay | Admitting: Cardiovascular Disease

## 2021-09-10 ENCOUNTER — Ambulatory Visit (INDEPENDENT_AMBULATORY_CARE_PROVIDER_SITE_OTHER): Payer: Managed Care, Other (non HMO) | Admitting: Cardiovascular Disease

## 2021-09-10 ENCOUNTER — Other Ambulatory Visit: Payer: Self-pay

## 2021-09-10 ENCOUNTER — Ambulatory Visit (INDEPENDENT_AMBULATORY_CARE_PROVIDER_SITE_OTHER): Payer: Managed Care, Other (non HMO)

## 2021-09-10 VITALS — BP 143/91 | HR 76 | Ht 72.0 in | Wt 262.4 lb

## 2021-09-10 DIAGNOSIS — R079 Chest pain, unspecified: Secondary | ICD-10-CM | POA: Diagnosis not present

## 2021-09-10 DIAGNOSIS — R55 Syncope and collapse: Secondary | ICD-10-CM

## 2021-09-10 DIAGNOSIS — R002 Palpitations: Secondary | ICD-10-CM

## 2021-09-10 DIAGNOSIS — I10 Essential (primary) hypertension: Secondary | ICD-10-CM | POA: Diagnosis not present

## 2021-09-10 NOTE — Progress Notes (Signed)
Cardiology Office Note   Date:  09/10/2021   ID:  Brandon Wiechman, DOB [birth year], MRN [id number]  PCP:  Doreene Nest, NP  Cardiologist:   Lorine Bears, MD   Chief Complaint  Patient presents with   Other    Syncope. Meds reviewed verbally with pt.      History of Present Illness: Arthur Ford is a 41 y.o. male who was referred by Vernona Rieger for evaluation of syncope. He has known history of testosterone deficiency, osteoarthritis and essential hypertension. 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  He used to drink 2-3 energy drinks daily and drink alcohol in the evening on a regular basis.  He went for a haircut before Christmas and felt that the neck apron was too tight.  He felt dizzy, lightheaded and flushed followed by loss of consciousness while he was in the chair.  He did not fall on the ground.  He was out briefly and was able to finish the haircut and then go home.  He continued to feel dizzy and lightheaded with intermittent palpitations and tachycardia.  He was visiting family in [place] and had increased palpitations and dizziness and thus went to a local emergency room there.  He had negative work-up.  Around the same time, his blood pressure was running high which is unusual for him.  As a kid, he had multiple syncopal episodes with hypotension.  He was recently started on small dose hydrochlorothiazide by his primary care physician.  He stopped drinking alcohol and stopped energy drinks.  He does report some episodes of substernal chest tightness and shortness of breath.  He has been more tired than usual.  Past Medical History:  Diagnosis Date   Blood in stool    Polycythemia    Separation anxiety    Testosterone deficiency    Tinnitus     Past Surgical History:  Procedure Laterality Date   CARPAL TUNNEL RELEASE Right    TESTICLE REMOVAL       Current Outpatient Medications  Medication Sig Dispense Refill    hydrochlorothiazide (HYDRODIURIL) 12.5 MG tablet Take 1 tablet (12.5 mg total) by mouth daily. For blood pressure. 90 tablet 3   ibuprofen (ADVIL,MOTRIN) 200 MG tablet Take 200 mg by mouth every 6 (six) hours as needed.     NEEDLE, DISP, 22 G 22G X 1-1/2" MISC by Does not apply route.     sertraline (ZOLOFT) 50 MG tablet Take 1 tablet (50 mg total) by mouth daily. For anxiety. 60 tablet 0   Testosterone Cypionate 100 MG/ML SOLN Inject 0.5 mLs into the muscle.      traZODone (DESYREL) 50 MG tablet 25 mg ( 1/2 pill) after dinner, and then 50 mg ( 1 whole pill)  at bed time.     No current facility-administered medications for this visit.    Allergies:   Patient has no known allergies.    Social History:  The patient  reports that he has been smoking cigars. He has never used smokeless tobacco. He reports current alcohol use. He reports that he does not use drugs.   Family History:  The patient's family history includes Alcohol abuse in his father; Diabetes in his mother; Heart disease in his paternal grandfather; Hypertension in his father and mother; Kidney disease in his mother; Ovarian cancer in his mother; Stroke in his father and mother.    ROS:  Please see the history of present illness.  Otherwise, review of systems are positive for none.   All other systems are reviewed and negative.    PHYSICAL EXAM: VS:  BP (!) 143/91 (BP Location: Left Arm, Patient Position: Sitting, Cuff Size: Large)    Pulse 76    Ht 6' (1.829 m)    Wt 262 lb 6 oz (119 kg)    SpO2 98%    BMI 35.58 kg/m  , BMI Body mass index is 35.58 kg/m. GEN: Well nourished, well developed, in no acute distress  HEENT: normal  Neck: no JVD, carotid bruits, or masses Cardiac: RRR; no murmurs, rubs, or gallops,no edema  Respiratory:  clear to auscultation bilaterally, normal work of breathing GI: soft, nontender, nondistended, + BS MS: no deformity or atrophy  Skin: warm and dry, no rash Neuro:  Strength and sensation are  intact Psych: euthymic mood, full affect   EKG:  EKG is ordered today. The ekg ordered today demonstrates normal sinus rhythm with no significant ST or T wave changes.   Recent Labs: 08/20/2021: Hemoglobin 17.5; Platelets 261.0 09/04/2021: BUN 22; Creatinine, Ser 1.22; Potassium 4.2; Sodium 137    Lipid Panel No results found for: CHOL, TRIG, HDL, CHOLHDL, VLDL, LDLCALC, LDLDIRECT    Wt Readings from Last 3 Encounters:  09/10/21 262 lb 6 oz (119 kg)  09/04/21 263 lb 4 oz (119.4 kg)  08/20/21 272 lb (123.4 kg)      PAD Screen 09/10/2021  Previous PAD dx? No  Previous surgical procedure? No  Pain with walking? No  Feet/toe relief with dangling? No  Painful, non-healing ulcers? No  Extremities discolored? No      ASSESSMENT AND PLAN:  1.  Vasovagal syncope: The history is highly suggestive of vasovagal syncope.  However, given associated palpitations and tachycardia noted on his watch, we will obtain a 2-week ZIO monitor to exclude arrhythmia. I advised him not to use energy drinks and he already cut down on alcohol as well.  2.  Atypical chest pain and fatigue: I requested a treadmill stress test.  3.  Essential hypertension: Blood pressure improved with addition of small dose hydrochlorothiazide.  4.  Obesity with a BMI of 36: I discussed with him the importance of healthy lifestyle changes.    Disposition:   FU with me as needed if cardiac testing is abnormal.  Signed,  Lorine Bears, MD  09/10/2021 9:34 AM    [facility]

## 2021-09-10 NOTE — Patient Instructions (Signed)
Medication Instructions:  Your physician recommends that you continue on your current medications as directed. Please refer to the Current Medication list given to you today.  *If you need a refill on your cardiac medications before your next appointment, please call your pharmacy*   Lab Work: None ordered If you have labs (blood work) drawn today and your tests are completely normal, you will receive your results only by: MyChart Message (if you have MyChart) OR A paper copy in the mail If you have any lab test that is abnormal or we need to change your treatment, we will call you to review the results.   Testing/Procedures: Your physician has requested that you have an exercise tolerance test. For further information please visit [email or web address] Please also follow instruction sheet, as given.  Your provider has ordered a heart monitor to wear for 14 days. This will be mailed to your home with instructions on placement. Once you have finished the time frame requested, you will return monitor in box provided.      Follow-Up: At [facility], you and your health needs are our priority.  As part of our continuing mission to provide you with exceptional heart care, we have created designated Provider Care Teams.  These Care Teams include your primary Cardiologist (physician) and Advanced Practice Providers (APPs -  Physician Assistants and Nurse Practitioners) who all work together to provide you with the care you need, when you need it.  We recommend signing up for the patient portal called "MyChart".  Sign up information is provided on this After Visit Summary.  MyChart is used to connect with patients for Virtual Visits (Telemedicine).  Patients are able to view lab/test results, encounter notes, upcoming appointments, etc.  Non-urgent messages can be sent to your provider as well.   To learn more about what you can do with MyChart, go to [email or web address].    Your next  appointment:   As needed  The format for your next appointment:   In Person  Provider:   You may see Lorine Bears, MD or one of the following Advanced Practice Providers on your designated Care Team:   Nicolasa Ducking, NP Eula Listen, PA-C Cadence Fransico Michael, PA-C{    Other Instructions   Please see your pre-test instructions for your exercise tolerance test below:  - you may eat a light breakfast/ lunch prior to your procedure - no caffeine for 24 hours prior to your test (coffee, tea, soft drinks, or chocolate)  - no smoking/ vaping for 4 hours prior to your test - you may take your regular medications the day of your test  - bring any inhalers with you to your test - wear comfortable clothing & tennis/ non-skid shoes to walk on the treadmill

## 2021-09-16 ENCOUNTER — Other Ambulatory Visit: Payer: Self-pay | Admitting: Primary Care

## 2021-09-16 DIAGNOSIS — F411 Generalized anxiety disorder: Secondary | ICD-10-CM

## 2021-09-26 ENCOUNTER — Telehealth: Payer: Self-pay | Admitting: *Deleted

## 2021-09-26 DIAGNOSIS — R079 Chest pain, unspecified: Secondary | ICD-10-CM

## 2021-09-26 NOTE — Telephone Encounter (Signed)
Pt scheduled for ETT tomorrow 09/27/21.   Called pt and reviewed instructions below.  Pt voiced understanding, has no questions and confirmed appt.   - you may eat a light breakfast/ lunch prior to your procedure - no caffeine for 24 hours prior to your test (coffee, tea, soft drinks, or chocolate)  - no smoking/ vaping for 4 hours prior to your test - you may take your regular medications the day of your test  - bring any inhalers with you to your test - wear comfortable clothing & tennis/ non-skid shoes to walk on the treadmill  Forwarding to Dr. Kirke Corin to sign attestation form.

## 2021-09-26 NOTE — Addendum Note (Signed)
Addended by: Jarvis Newcomer on: 09/26/2021 02:13 PM   Modules accepted: Orders

## 2021-09-27 ENCOUNTER — Other Ambulatory Visit: Payer: Self-pay

## 2021-09-27 ENCOUNTER — Ambulatory Visit: Payer: 59 | Admitting: Cardiovascular Disease

## 2021-09-27 ENCOUNTER — Ambulatory Visit (INDEPENDENT_AMBULATORY_CARE_PROVIDER_SITE_OTHER): Payer: Managed Care, Other (non HMO)

## 2021-09-27 DIAGNOSIS — R079 Chest pain, unspecified: Secondary | ICD-10-CM

## 2021-09-27 DIAGNOSIS — R002 Palpitations: Secondary | ICD-10-CM

## 2021-09-27 LAB — EXERCISE TOLERANCE TEST
Angina Index: 0
Base ST Depression (mm): 0 mm
Duke Treadmill Score: 10
Estimated workload: 11
Exercise duration (min): 9 min
Exercise duration (sec): 35 s
MPHR: 178 {beats}/min
Peak HR: 169 {beats}/min
Percent HR: 94 %
RPE: 14
Rest HR: 104 {beats}/min
ST Depression (mm): 0 mm

## 2021-09-27 NOTE — Addendum Note (Signed)
Addended by: Jarvis Newcomer on: 09/27/2021 07:22 AM   Modules accepted: Orders

## 2021-10-07 ENCOUNTER — Encounter: Payer: Self-pay | Admitting: Primary Care

## 2021-10-07 ENCOUNTER — Other Ambulatory Visit: Payer: Self-pay

## 2021-10-07 ENCOUNTER — Ambulatory Visit (INDEPENDENT_AMBULATORY_CARE_PROVIDER_SITE_OTHER): Payer: Managed Care, Other (non HMO) | Admitting: Primary Care

## 2021-10-07 VITALS — BP 118/74 | HR 79 | Temp 97.6°F | Ht 72.0 in | Wt 258.0 lb

## 2021-10-07 DIAGNOSIS — F411 Generalized anxiety disorder: Secondary | ICD-10-CM

## 2021-10-07 DIAGNOSIS — R55 Syncope and collapse: Secondary | ICD-10-CM

## 2021-10-07 DIAGNOSIS — I10 Essential (primary) hypertension: Secondary | ICD-10-CM

## 2021-10-07 MED ORDER — SERTRALINE HCL 100 MG PO TABS: 100.0000 mg | ORAL_TABLET | Freq: Every day | ORAL | 3 refills | Status: DC

## 2021-10-07 NOTE — Progress Notes (Signed)
Subjective:    Patient ID: Arthur Ford, male    DOB: 05-May-1981, 41 y.o.   MRN: [id number]  HPI  Arthur Ford is a very pleasant 41 y.o. male with a medical history of hypertension, anxiety, syncope who presents today for follow-up of hypertension and anxiety.  He was last evaluated on 09/04/2021 for follow-up of hypertension.  During this visit blood pressure had improved, but was still above goal.  His anxiety had progressed.  Given his presentation we initiated Zoloft 50 mg and asked him to return for follow-up.  Since his last visit he is compliant to both hydrochlorothiazide 12.5 mg daily and sertraline 50 mg daily. He's feeling some better, but he continues to notice symptoms of anxiety. Positive effects include less worry and reduced anxiety. He continues to experience fear about passing out, especially when surrounded by other people. Also continues to feel anxious with worry, mind racing thoughts.   He is checking his BP at home, is getting readings of 110's/70's. He has noticed a few dizzy symptoms, attributes this to his anxiety. He denies headaches, chest pain. He's traveled 2 hours from home for work, rides with another co-worker.   He has connected with therapy, is working on getting the paperwork sent in. He will be able to set up an appointment once this is complete.  BP Readings from Last 3 Encounters:  10/07/21 118/74  09/10/21 (!) 143/91  09/04/21 136/86      Review of Systems  Respiratory:  Negative for shortness of breath.   Cardiovascular:  Negative for chest pain.  Neurological:  Positive for dizziness. Negative for headaches.        Past Medical History:  Diagnosis Date   Blood in stool    Polycythemia    Separation anxiety    Testosterone deficiency    Tinnitus     Social History   Socioeconomic History   Marital status: Single    Spouse name: Not on file   Number of children: Not on file   Years of education: Not on file   Highest  education level: Not on file  Occupational History   Not on file  Tobacco Use   Smoking status: Light Smoker    Types: Cigars   Smokeless tobacco: Never  Substance and Sexual Activity   Alcohol use: Yes    Alcohol/week: 0.0 standard drinks    Comment: soical   Drug use: No   Sexual activity: Not on file  Other Topics Concern   Not on file  Social History Narrative   Divorced.   2 children.   Works as a Leisure centre manager.   Enjoys spending time with girlfriend.    Social Determinants of Health   Financial Resource Strain: Not on file  Food Insecurity: Not on file  Transportation Needs: Not on file  Physical Activity: Not on file  Stress: Not on file  Social Connections: Not on file  Intimate Partner Violence: Not on file    Past Surgical History:  Procedure Laterality Date   CARPAL TUNNEL RELEASE Right    TESTICLE REMOVAL      Family History  Problem Relation Age of Onset   Ovarian cancer Mother    Stroke Mother    Hypertension Mother    Kidney disease Mother    Diabetes Mother    Alcohol abuse Father    Stroke Father    Hypertension Father    Heart disease Paternal Grandfather     No Known Allergies  Current Outpatient Medications on File Prior to Visit  Medication Sig Dispense Refill   hydrochlorothiazide (HYDRODIURIL) 12.5 MG tablet Take 1 tablet (12.5 mg total) by mouth daily. For blood pressure. 90 tablet 3   ibuprofen (ADVIL,MOTRIN) 200 MG tablet Take 200 mg by mouth every 6 (six) hours as needed.     NEEDLE, DISP, 22 G 22G X 1-1/2" MISC by Does not apply route.     Testosterone Cypionate 100 MG/ML SOLN Inject 0.5 mLs into the muscle.      traZODone (DESYREL) 50 MG tablet 25 mg ( 1/2 pill) after dinner, and then 50 mg ( 1 whole pill)  at bed time.     No current facility-administered medications on file prior to visit.    BP 118/74    Pulse 79    Temp 97.6 F (36.4 C) (Temporal)    Ht 6' (1.829 m)    Wt 258 lb (117 kg)    SpO2 97%    BMI 34.99  kg/m  Objective:   Physical Exam Cardiovascular:     Rate and Rhythm: Normal rate and regular rhythm.  Pulmonary:     Effort: Pulmonary effort is normal.     Breath sounds: Normal breath sounds. No wheezing or rales.  Musculoskeletal:     Cervical back: Neck supple.  Skin:    General: Skin is warm and dry.  Neurological:     Mental Status: He is alert and oriented to person, place, and time.  Psychiatric:        Mood and Affect: Mood normal.          Assessment & Plan:      This visit occurred during the SARS-CoV-2 public health emergency.  Safety protocols were in place, including screening questions prior to the visit, additional usage of staff PPE, and extensive cleaning of exam room while observing appropriate contact time as indicated for disinfecting solutions.

## 2021-10-07 NOTE — Patient Instructions (Signed)
We increased the dose of your sertraline (Zoloft) to 100 mg.    For now, take 75 mg (1-1/2 tablet - of your 50 mg dose) by mouth daily for 1 week, then increase to 100 mg thereafter.  I sent a new prescription to your pharmacy for the 100 mg dose.  Only take 1 of these once daily.  Follow up with therapy.  It was a pleasure to see you today!

## 2021-10-07 NOTE — Assessment & Plan Note (Signed)
Controlled.  Continue hydrochlorothiazide 12.5 mg daily for blood pressure.

## 2021-10-07 NOTE — Assessment & Plan Note (Signed)
Improved slightly, not yet at goal.  Discussed options, I strongly advised he complete his paperwork so that he can meet with therapy.  Increase Zoloft to 75 mg daily x1 week, then increase to 100 mg thereafter.  New prescription sent to pharmacy.  He will update via MyChart in 1 month.

## 2021-10-07 NOTE — Assessment & Plan Note (Signed)
Reviewed stress test results per cardiology, normal.

## 2021-10-22 ENCOUNTER — Ambulatory Visit: Payer: Self-pay | Admitting: Primary Care

## 2021-12-13 ENCOUNTER — Other Ambulatory Visit: Payer: Self-pay | Admitting: Primary Care

## 2021-12-13 DIAGNOSIS — F411 Generalized anxiety disorder: Secondary | ICD-10-CM

## 2021-12-19 ENCOUNTER — Encounter: Payer: Self-pay | Admitting: Primary Care

## 2021-12-19 ENCOUNTER — Ambulatory Visit (INDEPENDENT_AMBULATORY_CARE_PROVIDER_SITE_OTHER): Payer: Managed Care, Other (non HMO) | Admitting: Primary Care

## 2021-12-19 VITALS — BP 108/68 | HR 105 | Ht 72.0 in | Wt 267.8 lb

## 2021-12-19 DIAGNOSIS — F411 Generalized anxiety disorder: Secondary | ICD-10-CM

## 2021-12-19 DIAGNOSIS — Z0000 Encounter for general adult medical examination without abnormal findings: Secondary | ICD-10-CM | POA: Insufficient documentation

## 2021-12-19 DIAGNOSIS — I10 Essential (primary) hypertension: Secondary | ICD-10-CM

## 2021-12-19 DIAGNOSIS — E349 Endocrine disorder, unspecified: Secondary | ICD-10-CM | POA: Diagnosis not present

## 2021-12-19 DIAGNOSIS — Z23 Encounter for immunization: Secondary | ICD-10-CM | POA: Diagnosis not present

## 2021-12-19 LAB — COMPREHENSIVE METABOLIC PANEL
ALT: 89 U/L — ABNORMAL HIGH (ref 0–53)
AST: 54 U/L — ABNORMAL HIGH (ref 0–37)
Albumin: 4.3 g/dL (ref 3.5–5.2)
Alkaline Phosphatase: 69 U/L (ref 39–117)
BUN: 13 mg/dL (ref 6–23)
CO2: 29 mEq/L (ref 19–32)
Calcium: 9.1 mg/dL (ref 8.4–10.5)
Chloride: 100 mEq/L (ref 96–112)
Creatinine, Ser: 1.14 mg/dL (ref 0.40–1.50)
GFR: 78.95 mL/min (ref >60.00)
Glucose, Bld: 131 mg/dL — ABNORMAL HIGH (ref 70–99)
Potassium: 4.1 mEq/L (ref 3.5–5.1)
Sodium: 137 mEq/L (ref 135–145)
Total Bilirubin: 0.6 mg/dL (ref 0.2–1.2)
Total Protein: 6.8 g/dL (ref 6.0–8.3)

## 2021-12-19 LAB — LIPID PANEL
Cholesterol: 246 mg/dL — ABNORMAL HIGH (ref 0–200)
HDL: 49.6 mg/dL (ref >39.00)
LDL Cholesterol: 166 mg/dL — ABNORMAL HIGH (ref 0–99)
NonHDL: 196.69
Total CHOL/HDL Ratio: 5
Triglycerides: 155 mg/dL — ABNORMAL HIGH (ref 0.0–149.0)
VLDL: 31 mg/dL (ref 0.0–40.0)

## 2021-12-19 LAB — CBC
HCT: 51.9 % (ref 39.0–52.0)
Hemoglobin: 17.4 g/dL — ABNORMAL HIGH (ref 13.0–17.0)
MCHC: 33.4 g/dL (ref 30.0–36.0)
MCV: 89.3 fl (ref 78.0–100.0)
Platelets: 256 10*3/uL (ref 150.0–400.0)
RBC: 5.81 Mil/uL (ref 4.22–5.81)
RDW: 13.6 % (ref 11.5–15.5)
WBC: 5.4 10*3/uL (ref 4.0–10.5)

## 2021-12-19 NOTE — Assessment & Plan Note (Signed)
Controlled.   Continue HCTZ 12.5 mg daily. CMP pending. 

## 2021-12-19 NOTE — Progress Notes (Signed)
? ?Subjective:  ? ? Patient ID: Arthur Ford, male    DOB: 1981-06-18, 41 y.o.   MRN: [id number] ? ?HPI ? ?Arthur Ford is a very pleasant 41 y.o. male who presents today for complete physical and follow up of chronic conditions. ? ?Immunizations: ?-Tetanus: Due, agrees today ?-Influenza: Did not complete last season  ?-Covid-19: 2 vaccines ? ?Diet: Fair diet. Is working to limit sweets. Fresh salads and lean protein. ?Exercise: No regular exercise. Active for work.  ? ?Eye exam: Completed last in 2018, plans on scheduling  ?Dental exam: Completed a few years ago ? ?BP Readings from Last 3 Encounters:  ?12/19/21 108/68  ?10/07/21 118/74  ?09/10/21 (!) 143/91  ? ? ? ? ?Review of Systems  ?Constitutional:  Negative for unexpected weight change.  ?HENT:  Negative for rhinorrhea.   ?Respiratory:  Negative for cough and shortness of breath.   ?Cardiovascular:  Negative for chest pain.  ?Gastrointestinal:  Negative for constipation and diarrhea.  ?Genitourinary:  Negative for difficulty urinating.  ?Musculoskeletal:  Negative for arthralgias and myalgias.  ?Skin:  Negative for rash.  ?Allergic/Immunologic: Negative for environmental allergies.  ?Neurological:  Negative for dizziness and headaches.  ?Psychiatric/Behavioral:  The patient is not nervous/anxious.   ? ?   ? ? ?Past Medical History:  ?Diagnosis Date  ? Blood in stool   ? Polycythemia   ? Separation anxiety   ? Testosterone deficiency   ? Tinnitus   ? ? ?Social History  ? ?Socioeconomic History  ? Marital status: Single  ?  Spouse name: Not on file  ? Number of children: Not on file  ? Years of education: Not on file  ? Highest education level: Not on file  ?Occupational History  ? Not on file  ?Tobacco Use  ? Smoking status: Former  ? Smokeless tobacco: Never  ?Vaping Use  ? Vaping Use: Never used  ?Substance and Sexual Activity  ? Alcohol use: Not Currently  ? Drug use: No  ? Sexual activity: Not on file  ?Other Topics Concern  ? Not on file  ?Social  History Narrative  ? Divorced.  ? 2 children.  ? Works as a Leisure centre manager.  ? Enjoys spending time with girlfriend.   ? ?Social Determinants of Health  ? ?Financial Resource Strain: Not on file  ?Food Insecurity: Not on file  ?Transportation Needs: Not on file  ?Physical Activity: Not on file  ?Stress: Not on file  ?Social Connections: Not on file  ?Intimate Partner Violence: Not on file  ? ? ?Past Surgical History:  ?Procedure Laterality Date  ? CARPAL TUNNEL RELEASE Right   ? TESTICLE REMOVAL    ? ? ?Family History  ?Problem Relation Age of Onset  ? Ovarian cancer Mother   ? Stroke Mother   ? Hypertension Mother   ? Kidney disease Mother   ? Diabetes Mother   ? Alcohol abuse Father   ? Stroke Father   ? Hypertension Father   ? Heart disease Paternal Grandfather   ? ? ?No Known Allergies ? ?Current Outpatient Medications on File Prior to Visit  ?Medication Sig Dispense Refill  ? hydrochlorothiazide (HYDRODIURIL) 12.5 MG tablet Take 1 tablet (12.5 mg total) by mouth daily. For blood pressure. 90 tablet 3  ? ibuprofen (ADVIL,MOTRIN) 200 MG tablet Take 200 mg by mouth every 6 (six) hours as needed.    ? NEEDLE, DISP, 22 G 22G X 1-1/2" MISC by Does not apply route.    ? sertraline (ZOLOFT)  100 MG tablet Take 1 tablet (100 mg total) by mouth daily. For anxiety. 90 tablet 3  ? Testosterone Cypionate 100 MG/ML SOLN Inject 0.5 mLs into the muscle.     ? ?No current facility-administered medications on file prior to visit.  ? ? ?BP 108/68   Pulse (!) 105   Ht 6' (1.829 m)   Wt 267 lb 12.8 oz (121.5 kg)   SpO2 95%   BMI 36.32 kg/m?  ?Objective:  ? Physical Exam ?HENT:  ?   Right Ear: Tympanic membrane and ear canal normal.  ?   Left Ear: Tympanic membrane and ear canal normal.  ?   Nose: Nose normal.  ?   Right Sinus: No maxillary sinus tenderness or frontal sinus tenderness.  ?   Left Sinus: No maxillary sinus tenderness or frontal sinus tenderness.  ?Eyes:  ?   Conjunctiva/sclera: Conjunctivae normal.  ?Neck:  ?    Thyroid: No thyromegaly.  ?   Vascular: No carotid bruit.  ?Cardiovascular:  ?   Rate and Rhythm: Normal rate and regular rhythm.  ?   Heart sounds: Normal heart sounds.  ?Pulmonary:  ?   Effort: Pulmonary effort is normal.  ?   Breath sounds: Normal breath sounds. No wheezing or rales.  ?Abdominal:  ?   General: Bowel sounds are normal.  ?   Palpations: Abdomen is soft.  ?   Tenderness: There is no abdominal tenderness.  ?Musculoskeletal:     ?   General: Normal range of motion.  ?   Cervical back: Neck supple.  ?Skin: ?   General: Skin is warm and dry.  ?Neurological:  ?   Mental Status: He is alert and oriented to person, place, and time.  ?   Cranial Nerves: No cranial nerve deficit.  ?   Deep Tendon Reflexes: Reflexes are normal and symmetric.  ?Psychiatric:     ?   Mood and Affect: Mood normal.  ? ? ? ? ? ?   ?Assessment & Plan:  ? ? ? ? ?This visit occurred during the SARS-CoV-2 public health emergency.  Safety protocols were in place, including screening questions prior to the visit, additional usage of staff PPE, and extensive cleaning of exam room while observing appropriate contact time as indicated for disinfecting solutions.  ?

## 2021-12-19 NOTE — Assessment & Plan Note (Signed)
Controlled. ? ?Continue sertraline 100 mg daily. ?Continue to monitor.  ?

## 2021-12-19 NOTE — Assessment & Plan Note (Signed)
Controlled. ? ?Following with Urology. ?Continue IM testosterone 50 mg once weekly. ?

## 2021-12-19 NOTE — Patient Instructions (Signed)
Stop by the lab prior to leaving today. I will notify you of your results once received.   It was a pleasure to see you today!  Preventive Care 43-43 Years Old, Male Preventive care refers to lifestyle choices and visits with your health care provider that can promote health and wellness. Preventive care visits are also called wellness exams. What can I expect for my preventive care visit? Counseling During your preventive care visit, your health care provider may ask about your: Medical history, including: Past medical problems. Family medical history. Current health, including: Emotional well-being. Home life and relationship well-being. Sexual activity. Lifestyle, including: Alcohol, nicotine or tobacco, and drug use. Access to firearms. Diet, exercise, and sleep habits. Safety issues such as seatbelt and bike helmet use. Sunscreen use. Work and work environment. Physical exam Your health care provider will check your: Height and weight. These may be used to calculate your BMI (body mass index). BMI is a measurement that tells if you are at a healthy weight. Waist circumference. This measures the distance around your waistline. This measurement also tells if you are at a healthy weight and may help predict your risk of certain diseases, such as type 2 diabetes and high blood pressure. Heart rate and blood pressure. Body temperature. Skin for abnormal spots. What immunizations do I need?  Vaccines are usually given at various ages, according to a schedule. Your health care provider will recommend vaccines for you based on your age, medical history, and lifestyle or other factors, such as travel or where you work. What tests do I need? Screening Your health care provider may recommend screening tests for certain conditions. This may include: Lipid and cholesterol levels. Diabetes screening. This is done by checking your blood sugar (glucose) after you have not eaten for a while  (fasting). Hepatitis B test. Hepatitis C test. HIV (human immunodeficiency virus) test. STI (sexually transmitted infection) testing, if you are at risk. Lung cancer screening. Prostate cancer screening. Colorectal cancer screening. Talk with your health care provider about your test results, treatment options, and if necessary, the need for more tests. Follow these instructions at home: Eating and drinking  Eat a diet that includes fresh fruits and vegetables, whole grains, lean protein, and low-fat dairy products. Take vitamin and mineral supplements as recommended by your health care provider. Do not drink alcohol if your health care provider tells you not to drink. If you drink alcohol: Limit how much you have to 0-2 drinks a day. Know how much alcohol is in your drink. In the [place] one drink equals one 12 oz bottle of beer (355 mL), one 5 oz glass of wine (148 mL), or one 1 oz glass of hard liquor (44 mL). Lifestyle Brush your teeth every morning and night with fluoride toothpaste. Floss one time each day. Exercise for at least 30 minutes 5 or more days each week. Do not use any products that contain nicotine or tobacco. These products include cigarettes, chewing tobacco, and vaping devices, such as e-cigarettes. If you need help quitting, ask your health care provider. Do not use drugs. If you are sexually active, practice safe sex. Use a condom or other form of protection to prevent STIs. Take aspirin only as told by your health care provider. Make sure that you understand how much to take and what form to take. Work with your health care provider to find out whether it is safe and beneficial for you to take aspirin daily. Find healthy ways to manage   stress, such as: Meditation, yoga, or listening to music. Journaling. Talking to a trusted person. Spending time with friends and family. Minimize exposure to UV radiation to reduce your risk of skin cancer. Safety Always wear  your seat belt while driving or riding in a vehicle. Do not drive: If you have been drinking alcohol. Do not ride with someone who has been drinking. When you are tired or distracted. While texting. If you have been using any mind-altering substances or drugs. Wear a helmet and other protective equipment during sports activities. If you have firearms in your house, make sure you follow all gun safety procedures. What's next? Go to your health care provider once a year for an annual wellness visit. Ask your health care provider how often you should have your eyes and teeth checked. Stay up to date on all vaccines. This information is not intended to replace advice given to you by your health care provider. Make sure you discuss any questions you have with your health care provider. Document Revised: 01/30/2021 Document Reviewed: 01/30/2021 Elsevier Patient Education  2023 [employer] Inc.  

## 2021-12-19 NOTE — Addendum Note (Signed)
Addended by: Winn Jock on: 12/19/2021 08:19 AM ? ? Modules accepted: Orders ? ?

## 2021-12-19 NOTE — Assessment & Plan Note (Signed)
Tetanus due, provided today. ? ?Discussed the importance of a healthy diet and regular exercise in order for weight loss, and to reduce the risk of further co-morbidity. ? ?Exam today much improved! ?Labs pending. ?

## 2022-04-24 ENCOUNTER — Ambulatory Visit (INDEPENDENT_AMBULATORY_CARE_PROVIDER_SITE_OTHER): Payer: Managed Care, Other (non HMO) | Admitting: Family Medicine

## 2022-04-24 VITALS — BP 118/82 | HR 108 | Temp 97.2°F | Wt 265.4 lb

## 2022-04-24 DIAGNOSIS — R002 Palpitations: Secondary | ICD-10-CM | POA: Insufficient documentation

## 2022-04-24 DIAGNOSIS — I10 Essential (primary) hypertension: Secondary | ICD-10-CM

## 2022-04-24 NOTE — Progress Notes (Signed)
Patient ID: Arthur Ford, male    DOB: July 20, 1981, 41 y.o.   MRN: [id number]  This visit was conducted in person.  BP (!) 122/100   Pulse (!) 108   Temp (!) 97.2 F (36.2 C) (Temporal)   Wt 265 lb 6 oz (120.4 kg)   SpO2 96%   BMI 35.99 kg/m    On repeat check HR 118/82 and Pulse 92  CC:  Chief Complaint  Patient presents with   Irregular Heart Beat    High and low    Fatigue    Subjective:   HPI: Arthur Ford is a 41 y.o. male  patient of  Mayra Reel NP with history of testosterone deficiency, generalized anxiety and hypertension presenting on 04/24/2022 for Irregular Heart Beat (High and low ) and Fatigue   3 days he has noted some acid reflux, abdominal bloating (lactose intolerant), felt tired.  Has continue with fatigue.  Hoarse voice.  He reports  he has noted in last 24 hours after working in the heat... excessive sweating, fatigue.  Drinking 2 gallons of water a day, 3 gatorades a day.  He thinks he has lost 13 lbs in last several days. Has had decreased appetite.   Today started feeling lightheaded., no passing out.  Noted heart rate on apple watch... HR 155, but also down to 38.   Mild  pain with deep breaths on left chest wall, feeling hear pounding, no SOB, no wheeze.  No swelling in ankles.   No ETOH, no smoking.  No cold symptoms.. no cough, no congestion. No bleeding.    GAD previously controlled on sertraline 100 mg daily  No dose changes of testosterone.  He is compliant with HCTZ  9-10 days ago. BP Readings from Last 3 Encounters:  04/24/22 (!) 122/100  12/19/21 108/68  10/07/21 118/74   Wt Readings from Last 3 Encounters:  04/24/22 265 lb 6 oz (120.4 kg)  12/19/21 267 lb 12.8 oz (121.5 kg)  10/07/21 258 lb (117 kg)   Normal stress test February 2023.  Cardiology evaluation for syncope.  Relevant past medical, surgical, family and social history reviewed and updated as indicated. Interim medical history since our last visit  reviewed. Allergies and medications reviewed and updated. Outpatient Medications Prior to Visit  Medication Sig Dispense Refill   hydrochlorothiazide (HYDRODIURIL) 12.5 MG tablet Take 1 tablet (12.5 mg total) by mouth daily. For blood pressure. 90 tablet 3   ibuprofen (ADVIL,MOTRIN) 200 MG tablet Take 200 mg by mouth every 6 (six) hours as needed.     NEEDLE, DISP, 22 G 22G X 1-1/2" MISC by Does not apply route.     sertraline (ZOLOFT) 100 MG tablet Take 1 tablet (100 mg total) by mouth daily. For anxiety. 90 tablet 3   Testosterone Cypionate 100 MG/ML SOLN Inject 0.5 mLs into the muscle.      No facility-administered medications prior to visit.     Per HPI unless specifically indicated in ROS section below Review of Systems  Constitutional:  Positive for fatigue. Negative for fever.  HENT:  Negative for ear pain.   Eyes:  Negative for pain.  Respiratory:  Negative for cough and shortness of breath.   Cardiovascular:  Positive for chest pain and palpitations. Negative for leg swelling.  Gastrointestinal:  Negative for abdominal pain.  Genitourinary:  Negative for dysuria.  Musculoskeletal:  Negative for arthralgias.  Neurological:  Negative for syncope, light-headedness and headaches.  Psychiatric/Behavioral:  Negative for dysphoric mood.  Objective:  BP (!) 122/100   Pulse (!) 108   Temp (!) 97.2 F (36.2 C) (Temporal)   Wt 265 lb 6 oz (120.4 kg)   SpO2 96%   BMI 35.99 kg/m   Wt Readings from Last 3 Encounters:  04/24/22 265 lb 6 oz (120.4 kg)  12/19/21 267 lb 12.8 oz (121.5 kg)  10/07/21 258 lb (117 kg)      Physical Exam Constitutional:      Appearance: He is well-developed.  HENT:     Head: Normocephalic.     Right Ear: Hearing normal.     Left Ear: Hearing normal.     Nose: Nose normal.  Neck:     Thyroid: No thyroid mass or thyromegaly.     Vascular: No carotid bruit.     Trachea: Trachea normal.  Cardiovascular:     Rate and Rhythm: Normal rate and  regular rhythm.     Pulses: Normal pulses.     Heart sounds: Heart sounds not distant. No murmur heard.    No friction rub. No gallop.     Comments: No peripheral edema Pulmonary:     Effort: Pulmonary effort is normal. No respiratory distress.     Breath sounds: Normal breath sounds.  Skin:    General: Skin is warm and dry.     Findings: No rash.  Psychiatric:        Speech: Speech normal.        Behavior: Behavior normal.        Thought Content: Thought content normal.       Results for orders placed or performed in visit on 12/19/21  Lipid panel  Result Value Ref Range   Cholesterol 246 (H) 0 - 200 mg/dL   Triglycerides 829.5155.0 (H) 0.0 - 149.0 mg/dL   HDL 62.1349.60 >08.65>39.00 mg/dL   VLDL 78.431.0 0.0 - 69.640.0 mg/dL   LDL Cholesterol 295166 (H) 0 - 99 mg/dL   Total CHOL/HDL Ratio 5    NonHDL 196.69   Comprehensive metabolic panel  Result Value Ref Range   Sodium 137 135 - 145 mEq/L   Potassium 4.1 3.5 - 5.1 mEq/L   Chloride 100 96 - 112 mEq/L   CO2 29 19 - 32 mEq/L   Glucose, Bld 131 (H) 70 - 99 mg/dL   BUN 13 6 - 23 mg/dL   Creatinine, Ser 2.841.14 0.40 - 1.50 mg/dL   Total Bilirubin 0.6 0.2 - 1.2 mg/dL   Alkaline Phosphatase 69 39 - 117 U/L   AST 54 (H) 0 - 37 U/L   ALT 89 (H) 0 - 53 U/L   Total Protein 6.8 6.0 - 8.3 g/dL   Albumin 4.3 3.5 - 5.2 g/dL   GFR 13.2478.95 >40.10>60.00 mL/min   Calcium 9.1 8.4 - 10.5 mg/dL  CBC  Result Value Ref Range   WBC 5.4 4.0 - 10.5 K/uL   RBC 5.81 4.22 - 5.81 Mil/uL   Platelets 256.0 150.0 - 400.0 K/uL   Hemoglobin 17.4 (H) 13.0 - 17.0 g/dL   HCT 27.251.9 53.639.0 - 64.452.0 %   MCV 89.3 78.0 - 100.0 fl   MCHC 33.4 30.0 - 36.0 g/dL   RDW 03.413.6 74.211.5 - 59.515.5 %     COVID 19 screen:  No recent travel or known exposure to COVID19 The patient denies respiratory symptoms of COVID 19 at this time. The importance of social distancing was discussed today.   Assessment and Plan Problem List Items Addressed This Visit  Essential hypertension    Acute Blood pressure  elevation upon arrival to office, improvement with rest.  Continue HCTZ 12.5 mg p.o. daily for now.        Relevant Orders   EKG 12-Lead (Completed)   Palpitations - Primary    Acute, associated with fatigue and possible overheating/dehydration on diuretic.  Will evaluate for electrolyte abnormality, anemia or new thyroid issue EKG within normal limits, no arrhythmia, no ST changes, possible borderline right atrial enlargement.  Slightly on differential is serotonin syndrome given its been using trazodone along with serotonin.  He will hold trazodone for now.  Continue sertraline 100 mg p.o. daily for anxiety.        Relevant Orders   EKG 12-Lead (Completed)   Comprehensive metabolic panel   CBC with Differential/Platelet   TSH       Kerby Nora, MD

## 2022-04-24 NOTE — Telephone Encounter (Signed)
Called patient set up in office with Dr. Ermalene Searing today.

## 2022-04-24 NOTE — Patient Instructions (Signed)
We will evaluate with labs.  Please push fluids and electrolytes.  Continue HCTZ 12.5 mg daily for now. Rest, remain out of work over the weekend and from heat. Follow blood pressure at home: Goal is less than 140/90. Hold trazodone for now.

## 2022-04-24 NOTE — Assessment & Plan Note (Addendum)
Acute, associated with fatigue and possible overheating/dehydration on diuretic.  Will evaluate for electrolyte abnormality, anemia or new thyroid issue EKG within normal limits, no arrhythmia, no ST changes, possible borderline right atrial enlargement.  Slightly on differential is serotonin syndrome given its been using trazodone along with serotonin.  He will hold trazodone for now.  Continue sertraline 100 mg p.o. daily for anxiety.

## 2022-04-24 NOTE — Assessment & Plan Note (Signed)
Acute Blood pressure elevation upon arrival to office, improvement with rest.  Continue HCTZ 12.5 mg p.o. daily for now.

## 2022-04-25 ENCOUNTER — Telehealth: Payer: Self-pay | Admitting: Radiology

## 2022-04-25 ENCOUNTER — Other Ambulatory Visit (INDEPENDENT_AMBULATORY_CARE_PROVIDER_SITE_OTHER): Payer: Managed Care, Other (non HMO) | Admitting: Family Medicine

## 2022-04-25 ENCOUNTER — Other Ambulatory Visit (INDEPENDENT_AMBULATORY_CARE_PROVIDER_SITE_OTHER): Payer: Managed Care, Other (non HMO)

## 2022-04-25 DIAGNOSIS — D751 Secondary polycythemia: Secondary | ICD-10-CM | POA: Diagnosis not present

## 2022-04-25 DIAGNOSIS — N289 Disorder of kidney and ureter, unspecified: Secondary | ICD-10-CM

## 2022-04-25 LAB — BASIC METABOLIC PANEL
BUN: 26 mg/dL — ABNORMAL HIGH (ref 6–23)
CO2: 31 mEq/L (ref 19–32)
Calcium: 10.2 mg/dL (ref 8.4–10.5)
Chloride: 94 mEq/L — ABNORMAL LOW (ref 96–112)
Creatinine, Ser: 1.36 mg/dL (ref 0.40–1.50)
GFR: 63.73 mL/min (ref >60.00)
Glucose, Bld: 100 mg/dL — ABNORMAL HIGH (ref 70–99)
Potassium: 3.7 mEq/L (ref 3.5–5.1)
Sodium: 133 mEq/L — ABNORMAL LOW (ref 135–145)

## 2022-04-25 LAB — CBC WITH DIFFERENTIAL/PLATELET
Basophils Absolute: 0.1 10*3/uL (ref 0.0–0.1)
Basophils Absolute: 0 10*3/uL (ref 0.0–0.1)
Basophils Relative: 0.7 % (ref 0.0–3.0)
Basophils Relative: 0.4 % (ref 0.0–3.0)
Eosinophils Absolute: 0.1 10*3/uL (ref 0.0–0.7)
Eosinophils Absolute: 0.1 10*3/uL (ref 0.0–0.7)
Eosinophils Relative: 1.1 % (ref 0.0–5.0)
Eosinophils Relative: 1.5 % (ref 0.0–5.0)
HCT: 59.2 % — ABNORMAL HIGH (ref 39.0–52.0)
HCT: 57.9 % — ABNORMAL HIGH (ref 39.0–52.0)
Hemoglobin: 20 g/dL (ref 13.0–17.0)
Hemoglobin: 19.5 g/dL (ref 13.0–17.0)
Lymphocytes Relative: 23 % (ref 12.0–46.0)
Lymphocytes Relative: 28.6 % (ref 12.0–46.0)
Lymphs Abs: 2.1 10*3/uL (ref 0.7–4.0)
Lymphs Abs: 1.9 10*3/uL (ref 0.7–4.0)
MCHC: 33.7 g/dL (ref 30.0–36.0)
MCHC: 33.7 g/dL (ref 30.0–36.0)
MCV: 88.9 fl (ref 78.0–100.0)
MCV: 88.5 fl (ref 78.0–100.0)
Monocytes Absolute: 0.8 10*3/uL (ref 0.1–1.0)
Monocytes Absolute: 0.6 10*3/uL (ref 0.1–1.0)
Monocytes Relative: 8.7 % (ref 3.0–12.0)
Monocytes Relative: 9.6 % (ref 3.0–12.0)
Neutro Abs: 6 10*3/uL (ref 1.4–7.7)
Neutro Abs: 3.9 10*3/uL (ref 1.4–7.7)
Neutrophils Relative %: 66.5 % (ref 43.0–77.0)
Neutrophils Relative %: 59.9 % (ref 43.0–77.0)
Platelets: 289 10*3/uL (ref 150.0–400.0)
Platelets: 277 10*3/uL (ref 150.0–400.0)
RBC: 6.66 Mil/uL — ABNORMAL HIGH (ref 4.22–5.81)
RBC: 6.54 Mil/uL — ABNORMAL HIGH (ref 4.22–5.81)
RDW: 13.5 % (ref 11.5–15.5)
RDW: 13.6 % (ref 11.5–15.5)
WBC: 9.1 10*3/uL (ref 4.0–10.5)
WBC: 6.5 10*3/uL (ref 4.0–10.5)

## 2022-04-25 LAB — COMPREHENSIVE METABOLIC PANEL
ALT: 34 U/L (ref 0–53)
AST: 28 U/L (ref 0–37)
Albumin: 4.9 g/dL (ref 3.5–5.2)
Alkaline Phosphatase: 68 U/L (ref 39–117)
BUN: 29 mg/dL — ABNORMAL HIGH (ref 6–23)
CO2: 26 mEq/L (ref 19–32)
Calcium: 10.6 mg/dL — ABNORMAL HIGH (ref 8.4–10.5)
Chloride: 92 mEq/L — ABNORMAL LOW (ref 96–112)
Creatinine, Ser: 1.9 mg/dL — ABNORMAL HIGH (ref 0.40–1.50)
GFR: 42.67 mL/min — ABNORMAL LOW (ref >60.00)
Glucose, Bld: 97 mg/dL (ref 70–99)
Potassium: 3.9 mEq/L (ref 3.5–5.1)
Sodium: 134 mEq/L — ABNORMAL LOW (ref 135–145)
Total Bilirubin: 1.5 mg/dL — ABNORMAL HIGH (ref 0.2–1.2)
Total Protein: 8 g/dL (ref 6.0–8.3)

## 2022-04-25 LAB — TSH: TSH: 1.29 u[IU]/mL (ref 0.35–5.50)

## 2022-04-25 NOTE — Telephone Encounter (Signed)
[facility] called a critical HGB - 20.0, results given to [facility]

## 2022-04-25 NOTE — Telephone Encounter (Signed)
This has been addressed.  See result note.

## 2022-04-25 NOTE — Telephone Encounter (Signed)
[facility] called a critical HGB - 19.5, results given to Dr Ermalene Searing

## 2022-04-28 NOTE — Telephone Encounter (Signed)
Routing to Dr. Ermalene Searing who saw him recently and ordered labs

## 2022-04-29 ENCOUNTER — Other Ambulatory Visit: Payer: Self-pay | Admitting: Family Medicine

## 2022-04-29 DIAGNOSIS — D751 Secondary polycythemia: Secondary | ICD-10-CM

## 2022-04-30 ENCOUNTER — Other Ambulatory Visit (INDEPENDENT_AMBULATORY_CARE_PROVIDER_SITE_OTHER): Payer: Managed Care, Other (non HMO)

## 2022-04-30 ENCOUNTER — Telehealth: Payer: Self-pay | Admitting: *Deleted

## 2022-04-30 DIAGNOSIS — D751 Secondary polycythemia: Secondary | ICD-10-CM

## 2022-04-30 LAB — CBC
HCT: 52.6 % — ABNORMAL HIGH (ref 39.0–52.0)
Hemoglobin: 17.6 g/dL — ABNORMAL HIGH (ref 13.0–17.0)
MCHC: 33.4 g/dL (ref 30.0–36.0)
MCV: 89.4 fl (ref 78.0–100.0)
Platelets: 255 10*3/uL (ref 150.0–400.0)
RBC: 5.88 Mil/uL — ABNORMAL HIGH (ref 4.22–5.81)
RDW: 13.4 % (ref 11.5–15.5)
WBC: 5.2 10*3/uL (ref 4.0–10.5)

## 2022-04-30 NOTE — Telephone Encounter (Signed)
Left a message on voicemail for patient to call the office and schedule a lab appointment today. Will send patient a mychart message also.

## 2022-04-30 NOTE — Telephone Encounter (Signed)
Left message for [place] that Dr. Mirian Mo wants to repeat another CBC.  I ask that he call the office and schedule a lab only appointment.  I also let him know we are unable to order the therapeutic phlebotomy but Dr. Ermalene Searing has ordered an urgent hematology referral but we can cancel that if his hemoglobin is dramatically improved on repeat CBC.

## 2022-04-30 NOTE — Telephone Encounter (Signed)
-----   Message from Excell Seltzer, MD sent at 04/29/2022  4:59 PM EDT ----- Regarding: FW: Cancellation of Order # [id number] Call patient to make sure he is aware of my request for repeat CBC.  I have discussed possible therapeutic phlebotomy with other physicians at my office.  We are unable to set this up but I will put in a referral for urgent hematology visit.  We can always cancel this if his hemoglobin is dramatically improved. ----- Message ----- From: Alvina Chou Sent: 04/29/2022   2:37 PM EDT To: Excell Seltzer, MD Subject: Cancellation of Order # [id number]              Order number [id number] for the procedure CBC, NO  DIFFERENTIAL/PLATELET [LAB340] has been canceled by Alvina Chou [[id number]]. This procedure was ordered by Excell Seltzer, MD  [[id number]] on Apr 29, 2022 for the patient Arthur Ford  [[id number]]. The reason for cancellation was "Duplicate".

## 2022-09-04 ENCOUNTER — Other Ambulatory Visit: Payer: Self-pay | Admitting: Primary Care

## 2022-09-04 DIAGNOSIS — F411 Generalized anxiety disorder: Secondary | ICD-10-CM

## 2022-09-04 DIAGNOSIS — I10 Essential (primary) hypertension: Secondary | ICD-10-CM

## 2022-12-05 ENCOUNTER — Other Ambulatory Visit: Payer: Self-pay | Admitting: Primary Care

## 2022-12-05 DIAGNOSIS — F411 Generalized anxiety disorder: Secondary | ICD-10-CM

## 2022-12-05 NOTE — Telephone Encounter (Signed)
Called patient to sched, stated he is currently on a work trip and is not near his calendar. Advised patient that he will be due after 5/4 for a CPE and said to call back whenever.

## 2022-12-05 NOTE — Telephone Encounter (Signed)
Patient is due for CPE/follow up in early May, this will be required prior to any further refills.  Please schedule, thank you!   

## 2022-12-09 ENCOUNTER — Other Ambulatory Visit: Payer: Self-pay | Admitting: Primary Care

## 2022-12-09 DIAGNOSIS — I10 Essential (primary) hypertension: Secondary | ICD-10-CM

## 2022-12-09 NOTE — Telephone Encounter (Signed)
Lvmtcb and sched

## 2022-12-09 NOTE — Telephone Encounter (Signed)
Patient is due for CPE/follow up, this will be required prior to any further refills.  Please schedule, thank you!   

## 2023-01-01 ENCOUNTER — Ambulatory Visit: Payer: Managed Care, Other (non HMO) | Admitting: Primary Care

## 2023-01-01 ENCOUNTER — Encounter: Payer: Self-pay | Admitting: Primary Care

## 2023-01-01 VITALS — BP 134/90 | HR 84 | Temp 97.3°F | Ht 72.0 in | Wt 261.0 lb

## 2023-01-01 DIAGNOSIS — F411 Generalized anxiety disorder: Secondary | ICD-10-CM

## 2023-01-01 DIAGNOSIS — I10 Essential (primary) hypertension: Secondary | ICD-10-CM

## 2023-01-01 DIAGNOSIS — E349 Endocrine disorder, unspecified: Secondary | ICD-10-CM

## 2023-01-01 DIAGNOSIS — Z0000 Encounter for general adult medical examination without abnormal findings: Secondary | ICD-10-CM

## 2023-01-01 DIAGNOSIS — G8929 Other chronic pain: Secondary | ICD-10-CM

## 2023-01-01 DIAGNOSIS — M542 Cervicalgia: Secondary | ICD-10-CM

## 2023-01-01 DIAGNOSIS — R002 Palpitations: Secondary | ICD-10-CM

## 2023-01-01 LAB — LIPID PANEL
Cholesterol: 225 mg/dL — ABNORMAL HIGH (ref 0–200)
HDL: 45.6 mg/dL (ref >39.00)
LDL Cholesterol: 166 mg/dL — ABNORMAL HIGH (ref 0–99)
NonHDL: 179.19
Total CHOL/HDL Ratio: 5
Triglycerides: 65 mg/dL (ref 0.0–149.0)
VLDL: 13 mg/dL (ref 0.0–40.0)

## 2023-01-01 LAB — COMPREHENSIVE METABOLIC PANEL
ALT: 19 U/L (ref 0–53)
AST: 22 U/L (ref 0–37)
Albumin: 4.4 g/dL (ref 3.5–5.2)
Alkaline Phosphatase: 57 U/L (ref 39–117)
BUN: 17 mg/dL (ref 6–23)
CO2: 29 mEq/L (ref 19–32)
Calcium: 9.5 mg/dL (ref 8.4–10.5)
Chloride: 100 mEq/L (ref 96–112)
Creatinine, Ser: 1.11 mg/dL (ref 0.40–1.50)
GFR: 80.93 mL/min (ref >60.00)
Glucose, Bld: 109 mg/dL — ABNORMAL HIGH (ref 70–99)
Potassium: 4.1 mEq/L (ref 3.5–5.1)
Sodium: 138 mEq/L (ref 135–145)
Total Bilirubin: 1.1 mg/dL (ref 0.2–1.2)
Total Protein: 7 g/dL (ref 6.0–8.3)

## 2023-01-01 MED ORDER — AMLODIPINE BESYLATE 5 MG PO TABS
5.0000 mg | ORAL_TABLET | Freq: Every day | ORAL | 3 refills | Status: AC
Start: 2023-01-01 — End: ?

## 2023-01-01 NOTE — Assessment & Plan Note (Signed)
Resolved.  Continue to monitor.

## 2023-01-01 NOTE — Assessment & Plan Note (Addendum)
Side effects with HCTZ. BP also above goal.  Stop HCTZ 12.5 mg daily. Start amlodipine 5 mg daily.   We will plan to see him back in 2-3 weeks for BP check.  CMP pending.

## 2023-01-01 NOTE — Patient Instructions (Addendum)
Stop hydrochlorothiazide for blood pressure.  Start amlodipine 5 mg once daily for blood pressure.  Stop by the lab prior to leaving today. I will notify you of your results once received.   Reduce your sertraline (Zoloft) to 50 mg daily for 2 weeks, then message me on MyChart with an update.   Please schedule a follow up visit to meet back with me in 2-3 weeks for blood pressure check.   It was a pleasure to see you today!

## 2023-01-01 NOTE — Assessment & Plan Note (Signed)
Controlled.  Following with Urology. Continue 0.5 ml testosterone weekly.

## 2023-01-01 NOTE — Assessment & Plan Note (Signed)
Stable.  Following with chiropractor services.

## 2023-01-01 NOTE — Assessment & Plan Note (Signed)
Immunizations UTD.  Discussed the importance of a healthy diet and regular exercise in order for weight loss, and to reduce the risk of further co-morbidity.  Exam stable. Labs pending.  Follow up in 1 year for repeat physical.  

## 2023-01-01 NOTE — Assessment & Plan Note (Signed)
Much improved and is requesting to wean off Zoloft.   Will reduce down to 50 mg daily x 2 weeks, then reduce down to 25 mg thereafter x 2 weeks, then stop.  He will keep [place] updated if symptoms return.

## 2023-01-01 NOTE — Progress Notes (Signed)
Subjective:    Patient ID: Arthur Ford, male    DOB: 02-06-1981, 42 y.o.   MRN: [id number]  HPI  Arthur Ford is a very pleasant 42 y.o. male who presents today for complete physical and follow up of chronic conditions.  He would like to discuss his BP medication. Within the first 2 hours of taking HCTZ he feels sweaty, this lasts for about 30 minutes. He has noticed increased sweating a night. He does not check BP at home. He denies headaches, dizziness.   He would also like to wean off of his Zoloft. His anxiety has significantly improved. His palpitations have resolved.   Immunizations: -Tetanus: Completed in 2023  Diet: Fair diet.  Exercise: No regular exercise.  Eye exam: Completes annually  Dental exam: Completed several years ago   BP Readings from Last 3 Encounters:  01/01/23 (!) 134/90  04/24/22 118/82  12/19/21 108/68   Wt Readings from Last 3 Encounters:  01/01/23 261 lb (118.4 kg)  04/24/22 265 lb 6 oz (120.4 kg)  12/19/21 267 lb 12.8 oz (121.5 kg)      Review of Systems  Constitutional:  Positive for diaphoresis. Negative for unexpected weight change.  HENT:  Negative for rhinorrhea.   Respiratory:  Negative for cough and shortness of breath.   Cardiovascular:  Negative for chest pain.  Gastrointestinal:  Negative for constipation and diarrhea.  Genitourinary:  Negative for difficulty urinating.  Musculoskeletal:  Negative for arthralgias and myalgias.  Skin:  Negative for rash.  Allergic/Immunologic: Negative for environmental allergies.  Neurological:  Negative for dizziness and headaches.  Psychiatric/Behavioral:  The patient is not nervous/anxious.          Past Medical History:  Diagnosis Date   Blood in stool    Lower back pain 10/26/2017   Nasal congestion 04/23/2018   Polycythemia    Separation anxiety    Syncope 08/20/2021   Testosterone deficiency    Tinnitus     Social History   Socioeconomic History   Marital status:  Single    Spouse name: Not on file   Number of children: Not on file   Years of education: Not on file   Highest education level: Not on file  Occupational History   Not on file  Tobacco Use   Smoking status: Former   Smokeless tobacco: Never  Vaping Use   Vaping Use: Never used  Substance and Sexual Activity   Alcohol use: Not Currently   Drug use: No   Sexual activity: Not on file  Other Topics Concern   Not on file  Social History Narrative   Divorced.   2 children.   Works as a Engineer, mining.   Enjoys spending time with girlfriend.    Social Determinants of Health   Financial Resource Strain: Not on file  Food Insecurity: Not on file  Transportation Needs: Not on file  Physical Activity: Not on file  Stress: Not on file  Social Connections: Not on file  Intimate Partner Violence: Not on file    Past Surgical History:  Procedure Laterality Date   CARPAL TUNNEL RELEASE Right    TESTICLE REMOVAL      Family History  Problem Relation Age of Onset   Ovarian cancer Mother    Stroke Mother    Hypertension Mother    Kidney disease Mother    Diabetes Mother    Alcohol abuse Father    Stroke Father    Hypertension Father    Heart  disease Paternal Grandfather     No Known Allergies  Current Outpatient Medications on File Prior to Visit  Medication Sig Dispense Refill   ibuprofen (ADVIL,MOTRIN) 200 MG tablet Take 200 mg by mouth every 6 (six) hours as needed.     NEEDLE, DISP, 22 G 22G X 1-1/2" MISC by Does not apply route.     sertraline (ZOLOFT) 100 MG tablet TAKE 1 TABLET (100 MG TOTAL) BY MOUTH DAILY. FOR ANXIETY. 30 tablet 0   Testosterone Cypionate 100 MG/ML SOLN Inject 0.5 mLs into the muscle.      No current facility-administered medications on file prior to visit.    BP (!) 134/90   Pulse 84   Temp (!) 97.3 F (36.3 C) (Temporal)   Ht 6' (1.829 m)   Wt 261 lb (118.4 kg)   SpO2 98%   BMI 35.40 kg/m  Objective:   Physical Exam HENT:      Right Ear: Tympanic membrane and ear canal normal.     Left Ear: Tympanic membrane and ear canal normal.     Nose: Nose normal.     Right Sinus: No maxillary sinus tenderness or frontal sinus tenderness.     Left Sinus: No maxillary sinus tenderness or frontal sinus tenderness.  Eyes:     Conjunctiva/sclera: Conjunctivae normal.  Neck:     Thyroid: No thyromegaly.     Vascular: No carotid bruit.  Cardiovascular:     Rate and Rhythm: Normal rate and regular rhythm.     Heart sounds: Normal heart sounds.  Pulmonary:     Effort: Pulmonary effort is normal.     Breath sounds: Normal breath sounds. No wheezing or rales.  Abdominal:     General: Bowel sounds are normal.     Palpations: Abdomen is soft.     Tenderness: There is no abdominal tenderness.  Musculoskeletal:        General: Normal range of motion.     Cervical back: Neck supple.  Skin:    General: Skin is warm and dry.  Neurological:     Mental Status: He is alert and oriented to person, place, and time.     Cranial Nerves: No cranial nerve deficit.     Deep Tendon Reflexes: Reflexes are normal and symmetric.  Psychiatric:        Mood and Affect: Mood normal.           Assessment & Plan:  Preventative health care Assessment & Plan: Immunizations UTD.  Discussed the importance of a healthy diet and regular exercise in order for weight loss, and to reduce the risk of further co-morbidity.  Exam stable. Labs pending.  Follow up in 1 year for repeat physical.    GAD (generalized anxiety disorder) Assessment & Plan: Much improved and is requesting to wean off Zoloft.   Will reduce down to 50 mg daily x 2 weeks, then reduce down to 25 mg thereafter x 2 weeks, then stop.  He will keep [place] updated if symptoms return.    Testosterone deficiency Assessment & Plan: Controlled.  Following with Urology. Continue 0.5 ml testosterone weekly.   Essential hypertension Assessment & Plan: Side effects with HCTZ.  BP also above goal.  Stop HCTZ 12.5 mg daily. Start amlodipine 5 mg daily.   We will plan to see him back in 2-3 weeks for BP check.  CMP pending.  Orders: -     amLODIPine Besylate; Take 1 tablet (5 mg total) by mouth daily. for  blood pressure.  Dispense: 90 tablet; Refill: 3 -     Comprehensive metabolic panel -     Lipid panel  Palpitations Assessment & Plan: Resolved.  Continue to monitor.    Chronic neck pain Assessment & Plan: Stable.  Following with chiropractor services.         Doreene Nest, NP

## 2023-01-04 ENCOUNTER — Other Ambulatory Visit: Payer: Self-pay | Admitting: Primary Care

## 2023-01-04 DIAGNOSIS — F411 Generalized anxiety disorder: Secondary | ICD-10-CM

## 2023-01-08 ENCOUNTER — Other Ambulatory Visit: Payer: Self-pay | Admitting: Primary Care

## 2023-01-08 DIAGNOSIS — I10 Essential (primary) hypertension: Secondary | ICD-10-CM

## 2023-01-14 ENCOUNTER — Encounter: Payer: Self-pay | Admitting: Primary Care

## 2023-01-14 ENCOUNTER — Ambulatory Visit (INDEPENDENT_AMBULATORY_CARE_PROVIDER_SITE_OTHER): Payer: Managed Care, Other (non HMO) | Admitting: Primary Care

## 2023-01-14 VITALS — BP 130/86 | HR 82 | Temp 97.2°F | Ht 72.0 in | Wt 263.0 lb

## 2023-01-14 DIAGNOSIS — I10 Essential (primary) hypertension: Secondary | ICD-10-CM | POA: Diagnosis not present

## 2023-01-14 DIAGNOSIS — F411 Generalized anxiety disorder: Secondary | ICD-10-CM | POA: Diagnosis not present

## 2023-01-14 NOTE — Assessment & Plan Note (Signed)
Controlled and improved.  Continue amlodipine 5 mg daily. We also discussed that he can take this one at night.

## 2023-01-14 NOTE — Patient Instructions (Signed)
Continue amlodipine 5 mg daily for blood pressure.  Cut your Zoloft to 25 mg daily, take 25 mg daily for one week, then stop.  It was a pleasure to see you today!

## 2023-01-14 NOTE — Progress Notes (Signed)
Subjective:    Patient ID: Arthur Ford, male    DOB: [birth year], 42 y.o.   MRN: [id number]  HPI  Arthur Ford is a very pleasant 42 y.o. male with a history of hypertension, testosterone deficiency, GAD who presents today for follow-up of hypertension and anxiety.  He was last evaluated on 01/01/2023 for his annual physical.  During this visit he mentioned symptoms of diaphoresis that occurred within 30 minutes of taking his hydrochlorothiazide medication.  Given the symptoms we switched him to amlodipine 5 mg daily.  He also mentioned being ready to wean off of his Zoloft so we cut his Zoloft down to 50 mg. He is here for follow-up today.  Since his last visit he is compliant to amlodipine 5 mg daily. His sweating symptoms have improved. He denies swelling in the ankles, dizziness. He is checking BP at home which is running 120's/80's.   He's doing well on the Zoloft 50 mg dose. Denies side effects or increased anxiety, would like to wean off completely.   BP Readings from Last 3 Encounters:  01/14/23 130/86  01/01/23 (!) 134/90  04/24/22 118/82        Review of Systems  Constitutional:  Negative for diaphoresis.  Cardiovascular:  Negative for chest pain and leg swelling.  Neurological:  Negative for dizziness.  Psychiatric/Behavioral:  The patient is not nervous/anxious.          Past Medical History:  Diagnosis Date   Blood in stool    Lower back pain 10/26/2017   Nasal congestion 04/23/2018   Polycythemia    Separation anxiety    Syncope 08/20/2021   Testosterone deficiency    Tinnitus     Social History   Socioeconomic History   Marital status: Single    Spouse name: Not on file   Number of children: Not on file   Years of education: Not on file   Highest education level: Not on file  Occupational History   Not on file  Tobacco Use   Smoking status: Former   Smokeless tobacco: Never  Vaping Use   Vaping Use: Never used  Substance and Sexual  Activity   Alcohol use: Not Currently   Drug use: No   Sexual activity: Not on file  Other Topics Concern   Not on file  Social History Narrative   Divorced.   2 children.   Works as a Engineer, mining.   Enjoys spending time with girlfriend.    Social Determinants of Health   Financial Resource Strain: Not on file  Food Insecurity: Not on file  Transportation Needs: Not on file  Physical Activity: Not on file  Stress: Not on file  Social Connections: Not on file  Intimate Partner Violence: Not on file    Past Surgical History:  Procedure Laterality Date   CARPAL TUNNEL RELEASE Right    TESTICLE REMOVAL      Family History  Problem Relation Age of Onset   Ovarian cancer Mother    Stroke Mother    Hypertension Mother    Kidney disease Mother    Diabetes Mother    Alcohol abuse Father    Stroke Father    Hypertension Father    Heart disease Paternal Grandfather     No Known Allergies  Current Outpatient Medications on File Prior to Visit  Medication Sig Dispense Refill   amLODipine (NORVASC) 5 MG tablet Take 1 tablet (5 mg total) by mouth daily. for blood pressure. 90 tablet 3  ibuprofen (ADVIL,MOTRIN) 200 MG tablet Take 200 mg by mouth every 6 (six) hours as needed.     NEEDLE, DISP, 22 G 22G X 1-1/2" MISC by Does not apply route.     Testosterone Cypionate 100 MG/ML SOLN Inject 0.5 mLs into the muscle.      No current facility-administered medications on file prior to visit.    BP 130/86   Pulse 82   Temp (!) 97.2 F (36.2 C) (Temporal)   Ht 6' (1.829 m)   Wt 263 lb (119.3 kg)   SpO2 98%   BMI 35.67 kg/m  Objective:   Physical Exam Cardiovascular:     Rate and Rhythm: Normal rate and regular rhythm.  Pulmonary:     Effort: Pulmonary effort is normal.     Breath sounds: Normal breath sounds. No wheezing or rales.  Musculoskeletal:     Cervical back: Neck supple.  Skin:    General: Skin is warm and dry.  Neurological:     Mental Status: He is  alert and oriented to person, place, and time.           Assessment & Plan:  Essential hypertension Assessment & Plan: Controlled and improved.  Continue amlodipine 5 mg daily. We also discussed that he can take this one at night.   GAD (generalized anxiety disorder) Assessment & Plan: Controlled.  Wean further off Zoloft. Cut down to 25 mg daily x 1 week then stop. He has a pill cutter at home.   Will remove from medication list.         Doreene Nest, NP

## 2023-01-14 NOTE — Assessment & Plan Note (Signed)
Controlled.  Wean further off Zoloft. Cut down to 25 mg daily x 1 week then stop. He has a pill cutter at home.   Will remove from medication list.

## 2023-01-31 ENCOUNTER — Other Ambulatory Visit: Payer: Self-pay | Admitting: Primary Care

## 2023-01-31 DIAGNOSIS — F411 Generalized anxiety disorder: Secondary | ICD-10-CM

## 2024-03-31 ENCOUNTER — Emergency Department

## 2024-03-31 ENCOUNTER — Other Ambulatory Visit: Payer: Self-pay

## 2024-03-31 ENCOUNTER — Emergency Department
Admission: EM | Admit: 2024-03-31 | Discharge: 2024-03-31 | Disposition: A | Discharge status: Home / Self Care | Attending: Emergency Medicine | Admitting: Emergency Medicine

## 2024-03-31 DIAGNOSIS — F419 Anxiety disorder, unspecified: Secondary | ICD-10-CM | POA: Diagnosis present

## 2024-03-31 DIAGNOSIS — I10 Essential (primary) hypertension: Secondary | ICD-10-CM | POA: Insufficient documentation

## 2024-03-31 DIAGNOSIS — R0789 Other chest pain: Secondary | ICD-10-CM | POA: Diagnosis not present

## 2024-03-31 DIAGNOSIS — E860 Dehydration: Secondary | ICD-10-CM | POA: Diagnosis not present

## 2024-03-31 DIAGNOSIS — D45 Polycythemia vera: Secondary | ICD-10-CM | POA: Insufficient documentation

## 2024-03-31 DIAGNOSIS — D751 Secondary polycythemia: Secondary | ICD-10-CM

## 2024-03-31 LAB — CBC
HCT: 53.7 % — ABNORMAL HIGH (ref 39.0–52.0)
Hemoglobin: 19 g/dL — ABNORMAL HIGH (ref 13.0–17.0)
MCH: 30.1 pg (ref 26.0–34.0)
MCHC: 35.4 g/dL (ref 30.0–36.0)
MCV: 85 fL (ref 80.0–100.0)
Platelets: 322 K/uL (ref 150–400)
RBC: 6.32 MIL/uL — ABNORMAL HIGH (ref 4.22–5.81)
RDW: 12.1 % (ref 11.5–15.5)
WBC: 8.9 K/uL (ref 4.0–10.5)
nRBC: 0 % (ref 0.0–0.2)

## 2024-03-31 LAB — BASIC METABOLIC PANEL WITH GFR
Anion gap: 14 (ref 5–15)
BUN: 17 mg/dL (ref 6–20)
CO2: 20 mmol/L — ABNORMAL LOW (ref 22–32)
Calcium: 10 mg/dL (ref 8.9–10.3)
Chloride: 102 mmol/L (ref 98–111)
Creatinine, Ser: 1.17 mg/dL (ref 0.61–1.24)
GFR, Estimated: >60 mL/min (ref >60)
Glucose, Bld: 107 mg/dL — ABNORMAL HIGH (ref 70–99)
Potassium: 3.4 mmol/L — ABNORMAL LOW (ref 3.5–5.1)
Sodium: 136 mmol/L (ref 135–145)

## 2024-03-31 LAB — TROPONIN I (HIGH SENSITIVITY)
Troponin I (High Sensitivity): 4 ng/L (ref <18)
Troponin I (High Sensitivity): 5 ng/L (ref <18)

## 2024-03-31 LAB — CK: Total CK: 213 U/L (ref 49–397)

## 2024-03-31 MED ORDER — LACTATED RINGERS IV BOLUS
1000.0000 mL | Freq: Once | INTRAVENOUS | Status: AC
  Administered 2024-03-31: 1000 mL via INTRAVENOUS

## 2024-03-31 MED ORDER — HYDROXYZINE HCL 25 MG PO TABS: 25.0000 mg | ORAL_TABLET | Freq: Three times a day (TID) | ORAL | 0 refills | Status: DC | PRN

## 2024-03-31 NOTE — ED Provider Notes (Signed)
 [facility] Provider Note    Event Date/Time   First MD Initiated Contact with Patient 03/31/24 1716     (approximate)   History   Chief Complaint Panic Attack   HPI  Arthur Ford is a 43 y.o. male with past medical history of hypertension, polycythemia, and anxiety who presents to the ED complaining of chest pain.  Patient reports that over the past 4 hours he has been dealing with waxing and waning pressure in his chest, tingling in both hands, and some mild difficulty breathing.  He states he has been working outside in the heat during this time, but has not felt any better with rest.  He does report 1 similar episode a couple of years ago, when he had cardiac testing that was unremarkable and he was told it was likely due to anxiety.  He was weaned off of his anxiety medicine about 1 year ago, states he has been doing fine until today.  He does admit to feeling anxious but denies any suicidal or homicidal ideation.  He does take testosterone  replacement prescribed by his urologist and thought to cause his polycythemia, has required phlebotomy for this once previously.     Physical Exam   Triage Vital Signs: ED Triage Vitals  Encounter Vitals Group     BP 03/31/24 1626 (!) 157/111     Girls Systolic BP Percentile --      Girls Diastolic BP Percentile --      Boys Systolic BP Percentile --      Boys Diastolic BP Percentile --      Pulse Rate 03/31/24 1626 (!) 112     Resp 03/31/24 1626 20     Temp 03/31/24 1626 98.2 F (36.8 C)     Temp Source 03/31/24 1626 Oral     SpO2 03/31/24 1626 100 %     Weight 03/31/24 1624 270 lb (122.5 kg)     Height 03/31/24 1624 6' (1.829 m)     Head Circumference --      Peak Flow --      Pain Score 03/31/24 1624 6     Pain Loc --      Pain Education --      Exclude from Growth Chart --     Most recent vital signs: Vitals:   03/31/24 2140 03/31/24 2200  BP: (!) 138/95 (!) 139/91  Pulse: 96 92  Resp: 12 13   Temp:    SpO2: 98% 94%    Constitutional: Alert and oriented. Eyes: Conjunctivae are normal. Head: Atraumatic. Nose: No congestion/rhinnorhea. Mouth/Throat: Mucous membranes are moist.  Cardiovascular: Normal rate, regular rhythm. Grossly normal heart sounds.  2+ radial pulses bilaterally. Respiratory: Normal respiratory effort.  No retractions. Lungs CTAB. Gastrointestinal: Soft and nontender. No distention. Musculoskeletal: No lower extremity tenderness nor edema.  Neurologic:  Normal speech and language. No gross focal neurologic deficits are appreciated.    ED Results / Procedures / Treatments   Labs (all labs ordered are listed, but only abnormal results are displayed) Labs Reviewed  BASIC METABOLIC PANEL WITH GFR - Abnormal; Notable for the following components:      Result Value   Potassium 3.4 (*)    CO2 20 (*)    Glucose, Bld 107 (*)    All other components within normal limits  CBC - Abnormal; Notable for the following components:   RBC 6.32 (*)    Hemoglobin 19.0 (*)    HCT 53.7 (*)  All other components within normal limits  CK  TROPONIN I (HIGH SENSITIVITY)  TROPONIN I (HIGH SENSITIVITY)     EKG  ED ECG REPORT I, Carlin Palin, the attending physician, personally viewed and interpreted this ECG.   Date: 03/31/2024  EKG Time: 16:26  Rate: 109  Rhythm: sinus tachycardia  Axis: RAD  Intervals:none  ST&T Change: None  RADIOLOGY Chest x-ray reviewed and interpreted by me with no infiltrate, edema, or effusion.  PROCEDURES:  Critical Care performed: No  Procedures   MEDICATIONS ORDERED IN ED: Medications  lactated ringers  bolus 1,000 mL (0 mLs Intravenous Stopped 03/31/24 1923)     IMPRESSION / MDM / ASSESSMENT AND PLAN / ED COURSE  I reviewed the triage vital signs and the nursing notes.                              43 y.o. male with past medical history of hypertension, polycythemia, and anxiety who presents to the ED complaining  of chest pressure, arm tingling, and difficulty breathing since 1 PM this afternoon.  Patient's presentation is most consistent with acute presentation with potential threat to life or bodily function.  Differential diagnosis includes, but is not limited to, arrhythmia, ACS, PE, pneumonia, pneumothorax, musculoskeletal pain, GERD, anxiety, polycythemia.  Patient nontoxic-appearing and in no acute distress, vital signs remarkable for tachycardia and hypertension.  EKG shows no evidence of arrhythmia or ischemia, initial troponin within normal limits and will check second set.  Additional labs with polycythemia but no significant leukocytosis, electrolyte abnormality, or AKI.  Will discuss polycythemia with hematology, hydrate with IV fluids, and check CK level.  CK level unremarkable, Dr. Jacobo of hematology recommends proceeding with therapeutic phlebotomy with removal of 500 cc of blood.  Repeat troponin within normal limits and patient reports feeling better following IV fluids as well as phlebotomy, he was counseled to follow-up with PCP as well as hematology.  He was counseled to return to the ED for new or worsening symptoms, patient agrees with plan.      FINAL CLINICAL IMPRESSION(S) / ED DIAGNOSES   Final diagnoses:  Chest pressure  Anxiety  Dehydration  Polycythemia     Rx / DC Orders   ED Discharge Orders          Ordered    hydrOXYzine  (ATARAX ) 25 MG tablet  3 times daily PRN        03/31/24 2234             Note:  This document was prepared using Dragon voice recognition software and may include unintentional dictation errors.   Palin Carlin, MD 04/01/24 [phone]

## 2024-03-31 NOTE — ED Triage Notes (Addendum)
 Pt sts that he has a hx of anxiety and was taken off his meds recently. Pt sts that he has been having SOB with tingling in his fingers as well as a pressure on his chest. Pt also sts that he feels weak and just an overall heaviness of his body. Pt st that he has a hx of having high hemoglobin and this is what it felt like prior. Pt advises that his urologist and PCP manage his high hemoglobin levels.

## 2024-03-31 NOTE — ED Notes (Signed)
Pt ambulatory to bathroom with no difficulty 

## 2024-04-01 ENCOUNTER — Ambulatory Visit (INDEPENDENT_AMBULATORY_CARE_PROVIDER_SITE_OTHER): Admitting: Primary Care

## 2024-04-01 ENCOUNTER — Encounter: Payer: Self-pay | Admitting: Primary Care

## 2024-04-01 VITALS — BP 132/80 | HR 82 | Temp 97.7°F | Ht 72.0 in | Wt 266.0 lb

## 2024-04-01 DIAGNOSIS — F411 Generalized anxiety disorder: Secondary | ICD-10-CM

## 2024-04-01 MED ORDER — ESCITALOPRAM OXALATE 10 MG PO TABS: 10.0000 mg | ORAL_TABLET | Freq: Every day | ORAL | 0 refills | Status: DC

## 2024-04-01 NOTE — Assessment & Plan Note (Addendum)
 Uncontrolled.  ED notes and labs reviewed.  We discussed options for treatment including as needed versus daily treatment.  We agreed for daily treatment given his frequent symptoms.  Will trial Lexapro  10 mg daily.  Patient is to take 1/2 tablet daily for 8 days, then advance to 1 full tablet thereafter. We discussed possible side effects of headache, GI upset, drowsiness, SI.HI  Continue hydroxyzine  12.5 to 25 mg daily as needed.  Patient verbalized understanding.   Follow up in 4-6 weeks for re-evaluation.

## 2024-04-01 NOTE — Progress Notes (Signed)
 Subjective:    Patient ID: Arthur Ford, male    DOB: Nov 28, 1980, 43 y.o.   MRN: [id number]   History of Present Illness    Arthur Ford is a very pleasant 43 y.o. male with a history of hypertension, testosterone  deficiency, GAD, palpitations who presents today to discuss anxiety.  He presented to [facility] ED yesterday with symptoms of chest pain, chest pressure, and tingling to both hands, shortness of breath.  He is experienced the symptoms previously and was told he had anxiety after cardiac workup.  He has been off of his anxiety medicine for about 1 year, and has been doing well until yesterday.  During his stay in the ED his EKG was without acute ischemia, labs including troponin were normal.  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  Due to his polycythemia he underwent phlebotomy of 500 cc of blood.  He had 1 liter of IV fluids. He was discharged home later that evening with recommendations for PCP and hematology follow-up. He was prescribed hydroxyzine  to use PRN. He was referred to hematology for polycythemia, he has an appointment on 04/12/24.  Since yesterday he's feeling anxious with daily worrying, trouble relaxing, irritability. Symptoms are daily. He mostly likes to stay at home, doesn't like social events or to be around people. Symptoms really began about 3 weeks ago with anxiety and physical symptoms mentioned above.   Previously managed on Zoloft  50 or 100 mg for which he stopped about 1 year ago. He felt improved on this regimen but stopped as he felt better.   BP Readings from Last 3 Encounters:  04/01/24 132/80  03/31/24 (!) 139/91  01/14/23 130/86      Review of Systems  Respiratory:  Negative for shortness of breath.   Cardiovascular:  Negative for chest pain.  Psychiatric/Behavioral:  The patient is nervous/anxious.          Past Medical History:  Diagnosis Date   Blood in stool    Lower back pain  10/26/2017   Nasal congestion 04/23/2018   Polycythemia    Separation anxiety    Syncope 08/20/2021   Testosterone  deficiency    Tinnitus     Social History   Socioeconomic History   Marital status: Single    Spouse name: Not on file   Number of children: Not on file   Years of education: Not on file   Highest education level: Not on file  Occupational History   Not on file  Tobacco Use   Smoking status: Former   Smokeless tobacco: Never  Vaping Use   Vaping status: Never Used  Substance and Sexual Activity   Alcohol use: Not Currently   Drug use: No   Sexual activity: Not on file  Other Topics Concern   Not on file  Social History Narrative   Divorced.   2 children.   Works as a Engineer, mining.   Enjoys spending time with girlfriend.    Social Drivers of Corporate investment banker Strain: Low Risk  (07/20/2018)   Received from [facility]   Overall Financial Resource Strain (CARDIA)    Difficulty of Paying Living Expenses: Not hard at all  Food Insecurity: No Food Insecurity (07/20/2018)   Received from [facility]   Hunger Vital Sign    Worried About Running Out of Food in the Last Year: Never true    Ran Out of Food in the Last Year: Never true  Transportation  Needs: No Transportation Needs (07/20/2018)   Received from [facility]   PRAPARE - Transportation    Lack of Transportation (Medical): No    Lack of Transportation (Non-Medical): No  Physical Activity: Not on file  Stress: Not on file  Social Connections: Unknown (12/18/2021)   Received from [facility]   Social Network    Social Network: Not on file  Intimate Partner Violence: Unknown (11/21/2021)   Received from [facility]   HITS    Physically Hurt: Not on file    Insult or Talk Down To: Not on file    Threaten Physical Harm: Not on file    Scream or Curse: Not on file    Past Surgical History:  Procedure Laterality Date   CARPAL TUNNEL RELEASE Right    TESTICLE REMOVAL       Family History  Problem Relation Age of Onset   Ovarian cancer Mother    Stroke Mother    Hypertension Mother    Kidney disease Mother    Diabetes Mother    Alcohol abuse Father    Stroke Father    Hypertension Father    Heart disease Paternal Grandfather     No Known Allergies  Current Outpatient Medications on File Prior to Visit  Medication Sig Dispense Refill   ibuprofen (ADVIL,MOTRIN) 200 MG tablet Take 200 mg by mouth every 6 (six) hours as needed.     NEEDLE, DISP, 22 G 22G X 1-1/2 MISC by Does not apply route.     Testosterone  Cypionate 100 MG/ML SOLN Inject 0.5 mLs into the muscle.      amLODipine  (NORVASC ) 5 MG tablet Take 1 tablet (5 mg total) by mouth daily. for blood pressure. (Patient not taking: Reported on 04/01/2024) 90 tablet 3   hydrOXYzine  (ATARAX ) 25 MG tablet Take 1 tablet (25 mg total) by mouth 3 (three) times daily as needed. (Patient not taking: Reported on 04/01/2024) 12 tablet 0   No current facility-administered medications on file prior to visit.    BP 132/80   Pulse 82   Temp 97.7 F (36.5 C) (Temporal)   Ht 6' (1.829 m)   Wt 266 lb (120.7 kg)   SpO2 98%   BMI 36.08 kg/m  Objective:   Physical Exam Cardiovascular:     Rate and Rhythm: Normal rate and regular rhythm.  Pulmonary:     Effort: Pulmonary effort is normal.     Breath sounds: Normal breath sounds.  Musculoskeletal:     Cervical back: Neck supple.  Skin:    General: Skin is warm and dry.  Neurological:     Mental Status: He is alert and oriented to person, place, and time.  Psychiatric:        Mood and Affect: Mood normal.     Physical Exam        Assessment & Plan:  GAD (generalized anxiety disorder) Assessment & Plan: Uncontrolled.  ED notes and labs reviewed.  We discussed options for treatment including as needed versus daily treatment.  We agreed for daily treatment given his frequent symptoms.  Will trial Lexapro  10 mg daily.  Patient is to take  1/2 tablet daily for 8 days, then advance to 1 full tablet thereafter. We discussed possible side effects of headache, GI upset, drowsiness, SI.HI  Continue hydroxyzine  12.5 to 25 mg daily as needed.  Patient verbalized understanding.   Follow up in 4-6 weeks for re-evaluation.    Orders: -     Escitalopram  Oxalate;  Take 1 tablet (10 mg total) by mouth daily. For anxiety  Dispense: 90 tablet; Refill: 0    Assessment and Plan Assessment & Plan         Comer MARLA Gaskins, NP

## 2024-04-01 NOTE — Patient Instructions (Signed)
 Start Lexapro  10 mg mg for anxiety. Take 1/2 tablet by mouth once daily for about one week, then increase to 1 full tablet thereafter.   You may take the hydroxyzine  as needed for panic attacks.  Please schedule a follow up visit for 6 weeks for follow up of anxiety/depression.  It was a pleasure to see you today!

## 2024-04-11 ENCOUNTER — Other Ambulatory Visit: Payer: Self-pay | Admitting: Primary Care

## 2024-04-11 DIAGNOSIS — F411 Generalized anxiety disorder: Secondary | ICD-10-CM

## 2024-04-11 MED ORDER — HYDROXYZINE HCL 25 MG PO TABS: 25.0000 mg | ORAL_TABLET | Freq: Two times a day (BID) | ORAL | 0 refills | Status: DC | PRN

## 2024-04-11 NOTE — Telephone Encounter (Unsigned)
 Copied from CRM [id number]. Topic: Clinical - Medication Refill >> Apr 11, 2024  2:28 PM Abigail D wrote: Medication: hydrOXYzine  (ATARAX ) 25 MG tablet Patient was prescribed this medication at his ED visit on 8/14 but was only given 3 days worth. He said this medication has been very helpful and he is wondering if this is something Comer could prescribe a normal supply of. Patient is aware that she is out of office until 9/2 but he is wondering if there's anything that can be done in the meantime, patient has 2 more pills. Patient only takes 1x a day.   Has the patient contacted their pharmacy? No (Agent: If no, request that the patient contact the pharmacy for the refill. If patient does not wish to contact the pharmacy document the reason why and proceed with request.) (Agent: If yes, when and what did the pharmacy advise?)  This is the patient's preferred pharmacy:  [facility] [phone] [facility] [place] - [address] [place] [address] Phone: [phone] Fax: [phone]  Is this the correct pharmacy for this prescription? Yes If no, delete pharmacy and type the correct one.   Has the prescription been filled recently? Yes  Is the patient out of the medication? No  Has the patient been seen for an appointment in the last year OR does the patient have an upcoming appointment? Yes  Can we respond through MyChart? Yes  Agent: Please be advised that Rx refills may take up to 3 business days. We ask that you follow-up with your pharmacy.

## 2024-04-12 ENCOUNTER — Inpatient Hospital Stay: Attending: Oncology | Admitting: Oncology

## 2024-04-12 ENCOUNTER — Encounter: Payer: Self-pay | Admitting: Oncology

## 2024-04-12 ENCOUNTER — Inpatient Hospital Stay

## 2024-04-12 VITALS — BP 144/98 | HR 86 | Temp 98.6°F | Resp 18 | Ht 72.0 in | Wt 270.0 lb

## 2024-04-12 DIAGNOSIS — Z79899 Other long term (current) drug therapy: Secondary | ICD-10-CM | POA: Diagnosis not present

## 2024-04-12 DIAGNOSIS — D751 Secondary polycythemia: Secondary | ICD-10-CM | POA: Diagnosis present

## 2024-04-12 LAB — CBC (CANCER CENTER ONLY)
HCT: 51.9 % (ref 39.0–52.0)
Hemoglobin: 18.1 g/dL — ABNORMAL HIGH (ref 13.0–17.0)
MCH: 30.2 pg (ref 26.0–34.0)
MCHC: 34.9 g/dL (ref 30.0–36.0)
MCV: 86.6 fL (ref 80.0–100.0)
Platelet Count: 234 K/uL (ref 150–400)
RBC: 5.99 MIL/uL — ABNORMAL HIGH (ref 4.22–5.81)
RDW: 12.5 % (ref 11.5–15.5)
WBC Count: 5.6 K/uL (ref 4.0–10.5)
nRBC: 0 % (ref 0.0–0.2)

## 2024-04-12 LAB — IRON AND TIBC
Iron: 123 ug/dL (ref 45–182)
Saturation Ratios: 32 % (ref 17.9–39.5)
TIBC: 382 ug/dL (ref 250–450)
UIBC: 259 ug/dL

## 2024-04-12 LAB — FERRITIN: Ferritin: 71 ng/mL (ref 24–336)

## 2024-04-12 NOTE — Progress Notes (Unsigned)
 [facility]  [phone] [phone]  ID: Arthur Ford OB: [birth year]  MR#: [id number]  RDW#:[id number]  Patient Care Team: Gretta Comer POUR, NP as PCP - General (Internal Medicine)  CHIEF COMPLAINT: Secondary polycythemia.  INTERVAL HISTORY: Patient is a 43 year old male who was noted to have persistently elevated hemoglobin secondary to testosterone  use.  He is referred for further evaluation and consideration of phlebotomy.  Patient previously would donate blood to the [employer], but several years ago had a false positive for HIV and is no longer permitted to donate.  He reports multiple testing since then has returned negative.  He currently feels well and is asymptomatic.  He has no neurologic complaints.  He denies any recent fevers or illnesses.  He has a good appetite and denies weight loss.  He has no chest pain, shortness of breath, cough, or hemoptysis.  He denies any nausea, vomiting, constipation, or diarrhea.  He has no urinary complaints.  Patient offers no specific complaints today.  REVIEW OF SYSTEMS:   Review of Systems  Constitutional: Negative.  Negative for fever, malaise/fatigue and weight loss.  Respiratory: Negative.  Negative for cough, hemoptysis and shortness of breath.   Cardiovascular: Negative.  Negative for chest pain and leg swelling.  Gastrointestinal: Negative.  Negative for abdominal pain.  Genitourinary: Negative.  Negative for dysuria.  Musculoskeletal: Negative.  Negative for back pain.  Skin: Negative.  Negative for rash.  Neurological: Negative.  Negative for dizziness, focal weakness, weakness and headaches.  Psychiatric/Behavioral: Negative.  The patient is not nervous/anxious.     As per HPI. Otherwise, a complete review of systems is negative.  PAST MEDICAL HISTORY: Past Medical History:  Diagnosis Date   Blood in stool    Lower back pain 10/26/2017   Nasal congestion 04/23/2018   Polycythemia     Separation anxiety    Syncope 08/20/2021   Testosterone  deficiency    Tinnitus     PAST SURGICAL HISTORY: Past Surgical History:  Procedure Laterality Date   CARPAL TUNNEL RELEASE Right    TESTICLE REMOVAL      FAMILY HISTORY: Family History  Problem Relation Age of Onset   Ovarian cancer Mother    Stroke Mother    Hypertension Mother    Kidney disease Mother    Diabetes Mother    Alcohol abuse Father    Stroke Father    Hypertension Father    Heart disease Paternal Grandfather     ADVANCED DIRECTIVES (Y/N):  N  HEALTH MAINTENANCE: Social History   Tobacco Use   Smoking status: Former   Smokeless tobacco: Never  Vaping Use   Vaping status: Never Used  Substance Use Topics   Alcohol use: Not Currently   Drug use: No     Colonoscopy:  PAP:  Bone density:  Lipid panel:  No Known Allergies  Current Outpatient Medications  Medication Sig Dispense Refill   escitalopram  (LEXAPRO ) 10 MG tablet Take 1 tablet (10 mg total) by mouth daily. For anxiety 90 tablet 0   hydrOXYzine  (ATARAX ) 25 MG tablet Take 1 tablet (25 mg total) by mouth 2 (two) times daily as needed. 60 tablet 0   ibuprofen (ADVIL,MOTRIN) 200 MG tablet Take 200 mg by mouth every 6 (six) hours as needed.     NEEDLE, DISP, 22 G 22G X 1-1/2 MISC by Does not apply route.     Testosterone  Cypionate 100 MG/ML SOLN Inject 0.5 mLs into the muscle.      amLODipine  (  NORVASC ) 5 MG tablet Take 1 tablet (5 mg total) by mouth daily. for blood pressure. (Patient not taking: Reported on 04/12/2024) 90 tablet 3   No current facility-administered medications for this visit.    OBJECTIVE: Vitals:   04/12/24 1111  BP: (!) 144/98  Pulse: 86  Resp: 18  Temp: 98.6 F (37 C)  SpO2: 98%     Body mass index is 36.62 kg/m.    ECOG FS:0 - Asymptomatic  General: Well-developed, well-nourished, no acute distress. Eyes: Pink conjunctiva, anicteric sclera. HEENT: Normocephalic, moist mucous membranes. Lungs: No audible  wheezing or coughing. Heart: Regular rate and rhythm. Abdomen: Soft, nontender, no obvious distention. Musculoskeletal: No edema, cyanosis, or clubbing. Neuro: Alert, answering all questions appropriately. Cranial nerves grossly intact. Skin: No rashes or petechiae noted. Psych: Normal affect. Lymphatics: No cervical, calvicular, axillary or inguinal LAD.   LAB RESULTS:  Lab Results  Component Value Date   NA 136 03/31/2024   K 3.4 (L) 03/31/2024   CL 102 03/31/2024   CO2 20 (L) 03/31/2024   GLUCOSE 107 (H) 03/31/2024   BUN 17 03/31/2024   CREATININE 1.17 03/31/2024   CALCIUM 10.0 03/31/2024   PROT 7.0 01/01/2023   ALBUMIN 4.4 01/01/2023   AST 22 01/01/2023   ALT 19 01/01/2023   ALKPHOS 57 01/01/2023   BILITOT 1.1 01/01/2023   GFRNONAA >60 03/31/2024    Lab Results  Component Value Date   WBC 5.6 04/12/2024   NEUTROABS 3.9 04/25/2022   HGB 18.1 (H) 04/12/2024   HCT 51.9 04/12/2024   MCV 86.6 04/12/2024   PLT 234 04/12/2024     STUDIES: DG Chest 2 View Result Date: 03/31/2024 CLINICAL DATA:  Shortness of breath. EXAM: CHEST - 2 VIEW COMPARISON:  None Available. FINDINGS: The heart size and mediastinal contours are within normal limits. Both lungs are clear. The visualized skeletal structures are unremarkable. IMPRESSION: No active cardiopulmonary disease. Electronically Signed   By: Lynwood Landy Raddle M.D.   On: 03/31/2024 17:49    ASSESSMENT: Secondary polycythemia.  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  He has been on testosterone  supplement patient ever since.  He received 500 mL phlebotomy proxy 1 week ago in the emergency room.  His hemoglobin today is 18.0 therefore we will proceed with an additional phlebotomy.  Goal hemoglobin will be 17.0.  Return to clinic monthly for laboratory work and phlebotomy.  Patient will then return to clinic in 4 months for repeat laboratory work, further  evaluation, and continuation of treatment if needed.  I spent a total of 45 minutes reviewing chart data, face-to-face evaluation with the patient, counseling and coordination of care as detailed above.   Patient expressed understanding and was in agreement with this plan. He also understands that He can call clinic at any time with any questions, concerns, or complaints.    Evalene JINNY Reusing, MD   04/13/2024 7:41 AM    ]

## 2024-04-12 NOTE — Progress Notes (Unsigned)
 Patient is anxious, he has bad panic attacks.

## 2024-04-13 ENCOUNTER — Encounter: Payer: Self-pay | Admitting: Oncology

## 2024-04-13 DIAGNOSIS — D751 Secondary polycythemia: Secondary | ICD-10-CM | POA: Insufficient documentation

## 2024-04-13 LAB — CARBON MONOXIDE, BLOOD (PERFORMED AT REF LAB): Carbon Monoxide, Blood: 2.8 % (ref 0.0–3.6)

## 2024-04-13 LAB — ERYTHROPOIETIN: Erythropoietin: 13 m[IU]/mL (ref 2.6–18.5)

## 2024-04-15 ENCOUNTER — Inpatient Hospital Stay

## 2024-04-15 VITALS — BP 135/96 | HR 88 | Temp 96.8°F | Resp 18

## 2024-04-15 DIAGNOSIS — D751 Secondary polycythemia: Secondary | ICD-10-CM

## 2024-04-15 NOTE — Progress Notes (Signed)
 Arthur Ford presents today for phlebotomy per MD orders. Phlebotomy procedure started at 1005 and ended at 1017. 500 mls removed. Patient tolerated procedure well. IV needle removed intact.

## 2024-04-15 NOTE — Patient Instructions (Signed)

## 2024-04-22 LAB — JAK2 V617F RFX CALR/MPL/E12-15

## 2024-04-22 LAB — CALR +MPL + E12-E15  (REFLEX)

## 2024-04-29 ENCOUNTER — Ambulatory Visit: Admitting: Primary Care

## 2024-05-04 ENCOUNTER — Other Ambulatory Visit: Payer: Self-pay | Admitting: Primary Care

## 2024-05-04 DIAGNOSIS — F411 Generalized anxiety disorder: Secondary | ICD-10-CM

## 2024-05-04 NOTE — Telephone Encounter (Signed)
 Unable to reach patient. Left voicemail to return call to our office.

## 2024-05-04 NOTE — Telephone Encounter (Signed)
 Please call patient:  Received refill request for hydroxyzine  medication for anxiety attacks. Is he out of medication or running low?  If so, then how often is he taking?

## 2024-05-05 NOTE — Telephone Encounter (Signed)
 Called and spoke with patient, he has about 30 pills left, he takes 3-4 pills per week. Does not need a refill at this time.

## 2024-05-10 ENCOUNTER — Encounter: Payer: Self-pay | Admitting: Primary Care

## 2024-05-10 ENCOUNTER — Ambulatory Visit (INDEPENDENT_AMBULATORY_CARE_PROVIDER_SITE_OTHER): Admitting: Primary Care

## 2024-05-10 VITALS — BP 138/84 | HR 88 | Temp 97.5°F | Ht 72.0 in | Wt 278.0 lb

## 2024-05-10 DIAGNOSIS — F411 Generalized anxiety disorder: Secondary | ICD-10-CM | POA: Diagnosis not present

## 2024-05-10 MED ORDER — ESCITALOPRAM OXALATE 20 MG PO TABS: 20.0000 mg | ORAL_TABLET | Freq: Every day | ORAL | 0 refills | Status: DC

## 2024-05-10 NOTE — Assessment & Plan Note (Signed)
 Improving, not quite at goal, he agrees.  Referral to therapy offered, he kindly declines. Increase Lexapro  to 20 mg daily.  New prescription sent to pharmacy  He will update in 1 month.

## 2024-05-10 NOTE — Progress Notes (Signed)
 Subjective:    Patient ID: Arthur Ford, male    DOB: [birth year], 43 y.o.   MRN: [id number]  Arthur Ford is a very pleasant 43 y.o. male with a history of hypertension, GAD, palpitations who presents today for follow-up of anxiety and depression.  He was last evaluated on 04/01/2024 for symptoms of anxiety including daily worrying, trouble relaxing, irritability, social anxiety.  During this visit he was initiated on Lexapro  10 mg daily and his hydroxyzine  was continued as needed.  He is here for follow-up today.  Since his last visit he is compliant to Lexapro  10 mg daily. He is feeling improved. Positive effects include feeling less anxious, less irritability, slightly improved irritability. He continues to have difficulty sleeping. He is taking hydroxyzine  once daily to every other day.   He denies dizziness, GI upset, SI/HI. He is not seeing a therapist.   BP Readings from Last 3 Encounters:  05/10/24 138/84  04/15/24 (!) 135/96  04/12/24 (!) 144/98      Review of Systems  Respiratory:  Negative for shortness of breath.   Cardiovascular:  Negative for chest pain.  Psychiatric/Behavioral:  The patient is nervous/anxious.          Past Medical History:  Diagnosis Date   Blood in stool    Lower back pain 10/26/2017   Nasal congestion 04/23/2018   Polycythemia    Separation anxiety    Syncope 08/20/2021   Testosterone  deficiency    Tinnitus     Social History   Socioeconomic History   Marital status: Single    Spouse name: Not on file   Number of children: Not on file   Years of education: Not on file   Highest education level: Not on file  Occupational History   Not on file  Tobacco Use   Smoking status: Former   Smokeless tobacco: Never  Vaping Use   Vaping status: Never Used  Substance and Sexual Activity   Alcohol use: Not Currently   Drug use: No   Sexual activity: Not on file  Other Topics Concern   Not on file  Social History Narrative    Divorced.   2 children.   Works as a Engineer, mining.   Enjoys spending time with girlfriend.    Social Drivers of Corporate investment banker Strain: Low Risk  (07/20/2018)   Received from [facility]   Overall Financial Resource Strain (CARDIA)    Difficulty of Paying Living Expenses: Not hard at all  Food Insecurity: No Food Insecurity (04/12/2024)   Hunger Vital Sign    Worried About Running Out of Food in the Last Year: Never true    Ran Out of Food in the Last Year: Never true  Transportation Needs: No Transportation Needs (04/12/2024)   PRAPARE - Administrator, Civil Service (Medical): No    Lack of Transportation (Non-Medical): No  Physical Activity: Not on file  Stress: Not on file  Social Connections: Unknown (12/18/2021)   Received from [facility]   Social Network    Social Network: Not on file  Intimate Partner Violence: Not At Risk (04/12/2024)   Humiliation, Afraid, Rape, and Kick questionnaire    Fear of Current or Ex-Partner: No    Emotionally Abused: No    Physically Abused: No    Sexually Abused: No    Past Surgical History:  Procedure Laterality Date   CARPAL TUNNEL RELEASE Right    TESTICLE REMOVAL  Family History  Problem Relation Age of Onset   Ovarian cancer Mother    Stroke Mother    Hypertension Mother    Kidney disease Mother    Diabetes Mother    Alcohol abuse Father    Stroke Father    Hypertension Father    Heart disease Paternal Grandfather     No Known Allergies  Current Outpatient Medications on File Prior to Visit  Medication Sig Dispense Refill   hydrOXYzine  (ATARAX ) 25 MG tablet Take 1 tablet (25 mg total) by mouth 2 (two) times daily as needed. 60 tablet 0   ibuprofen (ADVIL,MOTRIN) 200 MG tablet Take 200 mg by mouth every 6 (six) hours as needed.     NEEDLE, DISP, 22 G 22G X 1-1/2 MISC by Does not apply route.     Testosterone  Cypionate 100 MG/ML SOLN Inject 0.5 mLs into the muscle.      amLODipine   (NORVASC ) 5 MG tablet Take 1 tablet (5 mg total) by mouth daily. for blood pressure. (Patient not taking: Reported on 05/10/2024) 90 tablet 3   No current facility-administered medications on file prior to visit.    BP 138/84   Pulse 88   Temp (!) 97.5 F (36.4 C) (Temporal)   Ht 6' (1.829 m)   Wt 278 lb (126.1 kg)   SpO2 96%   BMI 37.70 kg/m  Objective:   Physical Exam Cardiovascular:     Rate and Rhythm: Normal rate and regular rhythm.  Pulmonary:     Effort: Pulmonary effort is normal.     Breath sounds: Normal breath sounds.  Musculoskeletal:     Cervical back: Neck supple.  Skin:    General: Skin is warm and dry.  Neurological:     Mental Status: He is alert and oriented to person, place, and time.  Psychiatric:        Mood and Affect: Mood normal.     Physical Exam        Assessment & Plan:  GAD (generalized anxiety disorder) Assessment & Plan: Improving, not quite at goal, he agrees.  Referral to therapy offered, he kindly declines. Increase Lexapro  to 20 mg daily.  New prescription sent to pharmacy  He will update in 1 month.  Orders: -     Escitalopram  Oxalate; Take 1 tablet (20 mg total) by mouth daily. For anxiety  Dispense: 90 tablet; Refill: 0    Assessment and Plan Assessment & Plan         Comer MARLA Gaskins, NP    History of Present Illness

## 2024-05-12 ENCOUNTER — Other Ambulatory Visit: Payer: Self-pay | Admitting: *Deleted

## 2024-05-12 DIAGNOSIS — D751 Secondary polycythemia: Secondary | ICD-10-CM

## 2024-05-13 ENCOUNTER — Inpatient Hospital Stay: Attending: Oncology

## 2024-05-13 ENCOUNTER — Inpatient Hospital Stay

## 2024-05-13 DIAGNOSIS — D751 Secondary polycythemia: Secondary | ICD-10-CM | POA: Insufficient documentation

## 2024-05-13 DIAGNOSIS — Z79890 Hormone replacement therapy (postmenopausal): Secondary | ICD-10-CM | POA: Insufficient documentation

## 2024-05-13 LAB — HEMOGLOBIN AND HEMATOCRIT (CANCER CENTER ONLY)
HCT: 50.7 % (ref 39.0–52.0)
Hemoglobin: 17.5 g/dL — ABNORMAL HIGH (ref 13.0–17.0)

## 2024-05-13 NOTE — Patient Instructions (Signed)

## 2024-05-13 NOTE — Progress Notes (Signed)
 Codey Burling presents today for phlebotomy per MD orders. Phlebotomy procedure started at 0934 and ended at 0857. 500 mls removed. Patient tolerated procedure well. IV needle removed intact.

## 2024-06-12 ENCOUNTER — Other Ambulatory Visit: Payer: Self-pay | Admitting: Primary Care

## 2024-06-12 DIAGNOSIS — F411 Generalized anxiety disorder: Secondary | ICD-10-CM

## 2024-06-13 ENCOUNTER — Inpatient Hospital Stay: Attending: Oncology

## 2024-06-13 ENCOUNTER — Inpatient Hospital Stay

## 2024-06-13 DIAGNOSIS — D751 Secondary polycythemia: Secondary | ICD-10-CM | POA: Diagnosis present

## 2024-06-13 LAB — HEMOGLOBIN AND HEMATOCRIT (CANCER CENTER ONLY)
HCT: 48.9 % (ref 39.0–52.0)
Hemoglobin: 16.7 g/dL (ref 13.0–17.0)

## 2024-06-13 NOTE — Progress Notes (Signed)
 No phlebotomy today Hemoglobin 6.7 and the goal Hemoglobin is to be below 17.

## 2024-07-08 ENCOUNTER — Other Ambulatory Visit: Payer: Self-pay | Admitting: Primary Care

## 2024-07-08 DIAGNOSIS — F411 Generalized anxiety disorder: Secondary | ICD-10-CM

## 2024-07-11 ENCOUNTER — Inpatient Hospital Stay

## 2024-07-11 ENCOUNTER — Inpatient Hospital Stay: Attending: Oncology

## 2024-07-11 VITALS — BP 130/95 | HR 73 | Temp 96.5°F | Resp 18

## 2024-07-11 DIAGNOSIS — D751 Secondary polycythemia: Secondary | ICD-10-CM | POA: Insufficient documentation

## 2024-07-11 LAB — HEMOGLOBIN AND HEMATOCRIT (CANCER CENTER ONLY)
HCT: 49.6 % (ref 39.0–52.0)
Hemoglobin: 16.8 g/dL (ref 13.0–17.0)

## 2024-07-11 NOTE — Patient Instructions (Signed)

## 2024-07-11 NOTE — Progress Notes (Signed)
 Arthur Ford presents today for phlebotomy per MD orders. Phlebotomy procedure started at 0835 and ended at 0854. 500 mls removed. Patient tolerated procedure well. IV needle removed intact.

## 2024-08-01 DIAGNOSIS — F411 Generalized anxiety disorder: Secondary | ICD-10-CM

## 2024-08-06 ENCOUNTER — Other Ambulatory Visit: Payer: Self-pay | Admitting: Primary Care

## 2024-08-06 DIAGNOSIS — F411 Generalized anxiety disorder: Secondary | ICD-10-CM

## 2024-08-09 ENCOUNTER — Other Ambulatory Visit: Payer: Self-pay | Admitting: *Deleted

## 2024-08-09 DIAGNOSIS — D751 Secondary polycythemia: Secondary | ICD-10-CM

## 2024-08-15 ENCOUNTER — Inpatient Hospital Stay: Admitting: Oncology

## 2024-08-15 ENCOUNTER — Inpatient Hospital Stay: Attending: Oncology

## 2024-08-15 ENCOUNTER — Inpatient Hospital Stay

## 2024-08-15 ENCOUNTER — Encounter: Payer: Self-pay | Admitting: Oncology

## 2024-08-15 VITALS — BP 140/90 | HR 86 | Temp 98.6°F | Resp 18 | Ht 72.0 in | Wt 287.0 lb

## 2024-08-15 DIAGNOSIS — D751 Secondary polycythemia: Secondary | ICD-10-CM

## 2024-08-15 LAB — CBC WITH DIFFERENTIAL/PLATELET
Abs Immature Granulocytes: 0.02 K/uL (ref 0.00–0.07)
Basophils Absolute: 0.1 K/uL (ref 0.0–0.1)
Basophils Relative: 1 %
Eosinophils Absolute: 0.2 K/uL (ref 0.0–0.5)
Eosinophils Relative: 4 %
HCT: 49.2 % (ref 39.0–52.0)
Hemoglobin: 16.7 g/dL (ref 13.0–17.0)
Immature Granulocytes: 0 %
Lymphocytes Relative: 34 %
Lymphs Abs: 1.9 K/uL (ref 0.7–4.0)
MCH: 29 pg (ref 26.0–34.0)
MCHC: 33.9 g/dL (ref 30.0–36.0)
MCV: 85.4 fL (ref 80.0–100.0)
Monocytes Absolute: 0.6 K/uL (ref 0.1–1.0)
Monocytes Relative: 10 %
Neutro Abs: 2.7 K/uL (ref 1.7–7.7)
Neutrophils Relative %: 51 %
Platelets: 251 K/uL (ref 150–400)
RBC: 5.76 MIL/uL (ref 4.22–5.81)
RDW: 12.8 % (ref 11.5–15.5)
WBC: 5.4 K/uL (ref 4.0–10.5)
nRBC: 0 % (ref 0.0–0.2)

## 2024-08-15 NOTE — Progress Notes (Signed)
 " [facility]  Telephone:([phone] [phone]  ID: Arthur Ford OB: 09-30-1980  MR#: [id number]  RDW#:[id number]  Patient Care Team: Gretta Comer POUR, NP as PCP - General (Internal Medicine) Jacobo Evalene PARAS, MD as Consulting Physician (Oncology)  CHIEF COMPLAINT: Secondary polycythemia.  INTERVAL HISTORY: Patient returns to clinic today for repeat laboratory work, further evaluation, consideration of additional phlebotomy.  He has occasional constipation, but otherwise feels well.  He also has intermittent fatigue and hot flashes.  He has no neurologic complaints.  He denies any recent fevers or illnesses.  He has a good appetite and denies weight loss.  He has no chest pain, shortness of breath, cough, or hemoptysis.  He denies any nausea, vomiting, constipation, or diarrhea.  He has no urinary complaints.  Patient offers no further specific complaints today.  REVIEW OF SYSTEMS:   Review of Systems  Constitutional:  Positive for malaise/fatigue. Negative for fever and weight loss.  Respiratory: Negative.  Negative for cough, hemoptysis and shortness of breath.   Cardiovascular: Negative.  Negative for chest pain and leg swelling.  Gastrointestinal: Negative.  Negative for abdominal pain.  Genitourinary: Negative.  Negative for dysuria.  Musculoskeletal: Negative.  Negative for back pain.  Skin: Negative.  Negative for rash.  Neurological:  Positive for sensory change. Negative for dizziness, focal weakness, weakness and headaches.  Psychiatric/Behavioral: Negative.  The patient is not nervous/anxious.     As per HPI. Otherwise, a complete review of systems is negative.  PAST MEDICAL HISTORY: Past Medical History:  Diagnosis Date   Blood in stool    Lower back pain 10/26/2017   Nasal congestion 04/23/2018   Polycythemia    Separation anxiety    Syncope 08/20/2021   Testosterone  deficiency    Tinnitus     PAST SURGICAL HISTORY: Past  Surgical History:  Procedure Laterality Date   CARPAL TUNNEL RELEASE Right    TESTICLE REMOVAL      FAMILY HISTORY: Family History  Problem Relation Age of Onset   Ovarian cancer Mother    Stroke Mother    Hypertension Mother    Kidney disease Mother    Diabetes Mother    Alcohol abuse Father    Stroke Father    Hypertension Father    Heart disease Paternal Grandfather     ADVANCED DIRECTIVES (Y/N):  N  HEALTH MAINTENANCE: Social History   Tobacco Use   Smoking status: Former   Smokeless tobacco: Never  Vaping Use   Vaping status: Never Used  Substance Use Topics   Alcohol use: Not Currently   Drug use: No     Colonoscopy:  PAP:  Bone density:  Lipid panel:  No Known Allergies  Current Outpatient Medications  Medication Sig Dispense Refill   escitalopram  (LEXAPRO ) 20 MG tablet TAKE 1 TABLET (20 MG TOTAL) BY MOUTH DAILY. FOR ANXIETY 90 tablet 0   hydrOXYzine  (ATARAX ) 25 MG tablet TAKE 1 TABLET BY MOUTH 2 TIMES DAILY AS NEEDED. 60 tablet 0   ibuprofen (ADVIL,MOTRIN) 200 MG tablet Take 200 mg by mouth every 6 (six) hours as needed.     NEEDLE, DISP, 22 G 22G X 1-1/2 MISC by Does not apply route.     Testosterone  Cypionate 100 MG/ML SOLN Inject 0.5 mLs into the muscle.      amLODipine  (NORVASC ) 5 MG tablet Take 1 tablet (5 mg total) by mouth daily. for blood pressure. (Patient not taking: Reported on 05/10/2024) 90 tablet 3   No current facility-administered  medications for this visit.    OBJECTIVE: Vitals:   08/15/24 0954 08/15/24 0955  BP: (!) 156/100 (!) 140/90  Pulse: 86   Resp: 18   Temp: 98.6 F (37 C)   SpO2: 96%      Body mass index is 38.92 kg/m.    ECOG FS:0 - Asymptomatic  General: Well-developed, well-nourished, no acute distress. Eyes: Pink conjunctiva, anicteric sclera. HEENT: Normocephalic, moist mucous membranes. Lungs: No audible wheezing or coughing. Heart: Regular rate and rhythm. Abdomen: Soft, nontender, no obvious  distention. Musculoskeletal: No edema, cyanosis, or clubbing. Neuro: Alert, answering all questions appropriately. Cranial nerves grossly intact. Skin: No rashes or petechiae noted. Psych: Normal affect.   LAB RESULTS:  Lab Results  Component Value Date   NA 136 03/31/2024   K 3.4 (L) 03/31/2024   CL 102 03/31/2024   CO2 20 (L) 03/31/2024   GLUCOSE 107 (H) 03/31/2024   BUN 17 03/31/2024   CREATININE 1.17 03/31/2024   CALCIUM 10.0 03/31/2024   PROT 7.0 01/01/2023   ALBUMIN 4.4 01/01/2023   AST 22 01/01/2023   ALT 19 01/01/2023   ALKPHOS 57 01/01/2023   BILITOT 1.1 01/01/2023   GFRNONAA >60 03/31/2024    Lab Results  Component Value Date   WBC 5.4 08/15/2024   NEUTROABS 2.7 08/15/2024   HGB 16.7 08/15/2024   HCT 49.2 08/15/2024   MCV 85.4 08/15/2024   PLT 251 08/15/2024     STUDIES: No results found.   ASSESSMENT: Secondary polycythemia.  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  He has been on testosterone  supplement ever since.  Patient's hemoglobin is 16.7 today, therefore he does not require phlebotomy.  Goal hemoglobin is less than 17.0.  No further intervention is needed.  Return to clinic in 2 months for laboratory work and consideration of phlebotomy.  Patient would then return to clinic in 4 months for laboratory work, further evaluation, and continuation of treatment if needed.   Constipation: Continue OTC treatments as needed. Fatigue/hot flashes: Likely secondary to testosterone  levels.  Continue to follow-up with urology as scheduled.   Patient expressed understanding and was in agreement with this plan. He also understands that He can call clinic at any time with any questions, concerns, or complaints.    Evalene JINNY Reusing, MD   08/15/2024 11:14 AM    ] "

## 2024-08-15 NOTE — Progress Notes (Signed)
 Patient is having some constipation and his appetite is not very good lately.

## 2024-09-01 ENCOUNTER — Other Ambulatory Visit: Payer: Self-pay | Admitting: Primary Care

## 2024-09-01 DIAGNOSIS — F411 Generalized anxiety disorder: Secondary | ICD-10-CM

## 2024-09-16 ENCOUNTER — Inpatient Hospital Stay: Attending: Oncology

## 2024-09-16 ENCOUNTER — Ambulatory Visit: Payer: Self-pay

## 2024-09-16 ENCOUNTER — Telehealth: Payer: Self-pay | Admitting: *Deleted

## 2024-09-16 ENCOUNTER — Inpatient Hospital Stay

## 2024-09-16 ENCOUNTER — Other Ambulatory Visit: Payer: Self-pay

## 2024-09-16 VITALS — BP 138/90 | HR 89 | Temp 97.7°F | Resp 18

## 2024-09-16 DIAGNOSIS — D751 Secondary polycythemia: Secondary | ICD-10-CM

## 2024-09-16 LAB — HEMOGLOBIN AND HEMATOCRIT (CANCER CENTER ONLY)
HCT: 54.8 % — ABNORMAL HIGH (ref 39.0–52.0)
Hemoglobin: 18.1 g/dL — ABNORMAL HIGH (ref 13.0–17.0)

## 2024-09-16 NOTE — Telephone Encounter (Signed)
 Patient calling to request an appointment for lab possible phlebotomy. He feels fatigue. Due to present symptoms he has not been able to work for 3 days. Reports that he feels like he has had a hang over, but has not drank since 2022. Pt reports that he feels increased anxiety/anxiety attacks when his hgb rises. C/o frequent cold sweats in the morning, but is on testosterone  inj. Pt requesting H&H to be drawn as soon as possible and possible therapeutic phlebotomy.

## 2024-09-16 NOTE — Telephone Encounter (Signed)
 Spoke with Amy in fluid clinic. Pt can be added for 3pm today for possible phleb to fluid clinic sch. Pt will need to come at 245 for labs.  I reached out to patient and he accepted the above apts.

## 2024-09-16 NOTE — Progress Notes (Signed)
 Arthur Ford presents today for phlebotomy per MD orders. Phlebotomy procedure started at 1528 and ended at 1547. 500 mls removed. Patient tolerated procedure well. IV needle removed intact.

## 2024-09-16 NOTE — Patient Instructions (Signed)

## 2024-09-16 NOTE — Telephone Encounter (Signed)
 FYI Only or Action Required?: Action required by provider: ED refusal.  Patient was last seen in primary care on 05/10/2024 by Gretta Comer POUR, NP.  Called Nurse Triage reporting Fatigue and Anxiety.  Symptoms began several days ago.  Interventions attempted: Nothing.  Symptoms are: unchanged.  Triage Disposition: Go to ED Now (overriding Call EMS 911 Now)  Patient/caregiver understands and will follow disposition?: No, wishes to speak with PCP                      Message from [facility] S sent at 09/16/2024  8:21 AM EST  Reason for Triage: patient has weakness and always tired. Experiencing anxiety pretty bad lately on medication not sure if it's the anxiety medication or red blood count   Reason for Disposition  [1] Chest pain lasts > 5 minutes AND [2] described as crushing, pressure-like, or heavy  Answer Assessment - Initial Assessment Questions Patient calling in and states he has been dealing with fatigue and anxiety for a couple of years. Recently worsening, unable to go to work since Tuesday. Symptoms: Fatigue, anxiety, cold sweats, chest pressure this morning since 0430-0500, daily over the past 5-6 days. Went to ED with the same issues back in July and was told it was a panic attack. Refusing RN's disposition for ED. Wants to come in to office and have his hemoglobin checked.  Called [facility] and notified, Emmie of ED refusal.  Protocols used: Chest Pain-A-AH

## 2024-10-14 ENCOUNTER — Inpatient Hospital Stay

## 2024-12-09 ENCOUNTER — Inpatient Hospital Stay

## 2024-12-09 ENCOUNTER — Inpatient Hospital Stay: Admitting: Oncology
